# Patient Record
Sex: Female | Born: 1985 | Race: Black or African American | Hispanic: No | Marital: Single | Smoking: Never smoker
Health system: Southern US, Community
[De-identification: ages and names within clinical notes are randomized; demographics above are authoritative.]

## PROBLEM LIST (undated history)

## (undated) DIAGNOSIS — R42 Dizziness and giddiness: Secondary | ICD-10-CM

## (undated) DIAGNOSIS — R51 Headache: Secondary | ICD-10-CM

## (undated) DIAGNOSIS — F419 Anxiety disorder, unspecified: Secondary | ICD-10-CM

## (undated) DIAGNOSIS — G35 Multiple sclerosis: Secondary | ICD-10-CM

## (undated) DIAGNOSIS — R55 Syncope and collapse: Secondary | ICD-10-CM

## (undated) DIAGNOSIS — R519 Headache, unspecified: Secondary | ICD-10-CM

## (undated) DIAGNOSIS — H539 Unspecified visual disturbance: Secondary | ICD-10-CM

## (undated) HISTORY — PX: NO PAST SURGERIES: SHX2092

## (undated) HISTORY — DX: Anxiety disorder, unspecified: F41.9

## (undated) HISTORY — DX: Headache, unspecified: R51.9

## (undated) HISTORY — DX: Unspecified visual disturbance: H53.9

## (undated) HISTORY — DX: Dizziness and giddiness: R42

## (undated) HISTORY — DX: Syncope and collapse: R55

## (undated) HISTORY — DX: Headache: R51

---

## 2014-04-09 ENCOUNTER — Emergency Department (HOSPITAL_COMMUNITY)
Admission: EM | Admit: 2014-04-09 | Discharge: 2014-04-09 | Disposition: A | Discharge status: Home / Self Care | Payer: BC Managed Care – PPO | Attending: Emergency Medicine | Admitting: Emergency Medicine

## 2014-04-09 ENCOUNTER — Encounter (HOSPITAL_COMMUNITY): Payer: Self-pay | Admitting: Emergency Medicine

## 2014-04-09 DIAGNOSIS — S99919A Unspecified injury of unspecified ankle, initial encounter: Secondary | ICD-10-CM

## 2014-04-09 DIAGNOSIS — S0993XA Unspecified injury of face, initial encounter: Secondary | ICD-10-CM | POA: Diagnosis present

## 2014-04-09 DIAGNOSIS — Y9389 Activity, other specified: Secondary | ICD-10-CM | POA: Insufficient documentation

## 2014-04-09 DIAGNOSIS — Z8669 Personal history of other diseases of the nervous system and sense organs: Secondary | ICD-10-CM | POA: Insufficient documentation

## 2014-04-09 DIAGNOSIS — S139XXA Sprain of joints and ligaments of unspecified parts of neck, initial encounter: Secondary | ICD-10-CM | POA: Diagnosis not present

## 2014-04-09 DIAGNOSIS — S8990XA Unspecified injury of unspecified lower leg, initial encounter: Secondary | ICD-10-CM | POA: Diagnosis not present

## 2014-04-09 DIAGNOSIS — S99929A Unspecified injury of unspecified foot, initial encounter: Secondary | ICD-10-CM

## 2014-04-09 DIAGNOSIS — S161XXA Strain of muscle, fascia and tendon at neck level, initial encounter: Secondary | ICD-10-CM

## 2014-04-09 DIAGNOSIS — S0990XA Unspecified injury of head, initial encounter: Secondary | ICD-10-CM | POA: Insufficient documentation

## 2014-04-09 DIAGNOSIS — Y92410 Unspecified street and highway as the place of occurrence of the external cause: Secondary | ICD-10-CM | POA: Diagnosis not present

## 2014-04-09 DIAGNOSIS — IMO0002 Reserved for concepts with insufficient information to code with codable children: Secondary | ICD-10-CM | POA: Insufficient documentation

## 2014-04-09 HISTORY — DX: Multiple sclerosis: G35
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
## 2014-04-09 NOTE — ED Provider Notes (Signed)
Medical screening examination/treatment/procedure(s) were performed by non-physician practitioner and as supervising physician I was immediately available for consultation/collaboration.   EKG Interpretation None        Charles B. Sheldon, MD 04/09/14 2157 

## 2014-04-09 NOTE — Discharge Instructions (Signed)
Take up to 800mg  of ibuprofen three times a day for the next 3-4 days (take with food). Alternate heat and ice on sore muscles.  Activity as tolerated.  Return to the ER if the pain worsens or you develop numbness or weakness in one of your extremities.
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
## 2014-08-30 ENCOUNTER — Inpatient Hospital Stay (HOSPITAL_COMMUNITY)
Admission: AD | Admit: 2014-08-30 | Discharge: 2014-08-30 | Disposition: A | Discharge status: Home / Self Care | Payer: BC Managed Care – PPO | Source: Ambulatory Visit | Attending: Obstetrics and Gynecology | Admitting: Obstetrics and Gynecology

## 2014-08-30 DIAGNOSIS — A64 Unspecified sexually transmitted disease: Secondary | ICD-10-CM | POA: Insufficient documentation

## 2014-08-30 MED ORDER — CEFTRIAXONE SODIUM 250 MG IJ SOLR
250.0000 mg | Freq: Once | INTRAMUSCULAR | Status: AC
  Administered 2014-08-30: 250 mg via INTRAMUSCULAR
  Filled 2014-08-30: qty 250

## 2014-08-30 NOTE — MAU Note (Signed)
No reaction, no complaints.

## 2014-08-30 NOTE — MAU Note (Signed)
Partner is in [place], "done with him".

## 2014-08-30 NOTE — MAU Note (Signed)
[facility] NP had called on Friday regarding pt treatment.  Pt did not show, came in today. Called office for order.

## 2015-03-22 ENCOUNTER — Ambulatory Visit: Payer: 59 | Admitting: Family

## 2015-03-30 ENCOUNTER — Ambulatory Visit (INDEPENDENT_AMBULATORY_CARE_PROVIDER_SITE_OTHER): Payer: 59 | Admitting: Physician Assistant

## 2015-03-30 VITALS — BP 100/70 | HR 82 | Temp 97.7°F | Resp 20 | Ht 66.5 in | Wt 150.8 lb

## 2015-03-30 DIAGNOSIS — N898 Other specified noninflammatory disorders of vagina: Secondary | ICD-10-CM | POA: Diagnosis not present

## 2015-03-30 DIAGNOSIS — Z113 Encounter for screening for infections with a predominantly sexual mode of transmission: Secondary | ICD-10-CM | POA: Diagnosis not present

## 2015-03-30 DIAGNOSIS — Z30011 Encounter for initial prescription of contraceptive pills: Secondary | ICD-10-CM

## 2015-03-30 DIAGNOSIS — G35 Multiple sclerosis: Secondary | ICD-10-CM

## 2015-03-30 DIAGNOSIS — Z309 Encounter for contraceptive management, unspecified: Secondary | ICD-10-CM | POA: Diagnosis not present

## 2015-03-30 DIAGNOSIS — B373 Candidiasis of vulva and vagina: Secondary | ICD-10-CM

## 2015-03-30 DIAGNOSIS — B3731 Acute candidiasis of vulva and vagina: Secondary | ICD-10-CM

## 2015-03-30 LAB — POCT URINALYSIS DIPSTICK
Bilirubin, UA: NEGATIVE
Blood, UA: NEGATIVE
Glucose, UA: NEGATIVE
Ketones, UA: NEGATIVE
NITRITE UA: NEGATIVE
Protein, UA: NEGATIVE
SPEC GRAV UA: 1.015
Urobilinogen, UA: 0.2
pH, UA: 6.5

## 2015-03-30 LAB — POCT WET PREP WITH KOH
KOH Prep POC: POSITIVE
RBC Wet Prep HPF POC: NEGATIVE
TRICHOMONAS UA: NEGATIVE
Yeast Wet Prep HPF POC: NEGATIVE

## 2015-03-30 LAB — RPR

## 2015-03-30 LAB — HIV ANTIBODY (ROUTINE TESTING W REFLEX): HIV 1&2 Ab, 4th Generation: NONREACTIVE

## 2015-03-30 LAB — POCT UA - MICROSCOPIC ONLY
Casts, Ur, LPF, POC: NEGATIVE
Crystals, Ur, HPF, POC: NEGATIVE
MUCUS UA: NEGATIVE
YEAST UA: NEGATIVE

## 2015-03-30 MED ORDER — FLUCONAZOLE 150 MG PO TABS: 150.0000 mg | ORAL_TABLET | Freq: Once | ORAL | Status: DC

## 2015-03-30 MED ORDER — DROSPIRENONE-ETHINYL ESTRADIOL 3-0.03 MG PO TABS: 1.0000 | ORAL_TABLET | Freq: Every day | ORAL | Status: DC

## 2015-03-30 NOTE — Progress Notes (Signed)
Subjective:    Patient ID: Kristin Johnson, female    DOB: [birth year], 29 y.o.   MRN: [id number]  HPI Patient presents for vaginal discharge that has been present for 1 month. Discharge is described as being white "cottage cheese". Discharge initially had an odor that went away and then returned. Unable to describe odor. Denies blood in the discharge, itching, vaginal/abdominal/flank pain, N/V, fever, urinary/bowel sx, or dyspareunia. Has one sexual partner that she has had for less than a year and they do not use condoms. Has tried vinegar douche and intravaginal yogurt without any relief. No OTC therapies used. Past h/o gonorrhea and chlamydia that have both been treated. LMP 03/14/15 was normal. Last pap 2015 was normal. NKDA.  Not currently using any method of birth control, but has used pills, patch, and NuvaRing in the past. Would not mind if she became pregnant, but would like to consider birth control as she is unsure if she would like to be pregnant by current partner.   Review of Systems  Constitutional: Negative for fever.  Gastrointestinal: Negative for nausea, vomiting, abdominal pain, diarrhea and constipation.  Genitourinary: Positive for vaginal discharge. Negative for dysuria, urgency, frequency, hematuria, flank pain, decreased urine volume, vaginal bleeding, difficulty urinating, vaginal pain, menstrual problem, pelvic pain and dyspareunia.  Musculoskeletal: Negative for back pain.      Objective:   Physical Exam  Constitutional: She is oriented to person, place, and time. She appears well-developed and well-nourished. No distress.  Blood pressure 100/70, pulse 82, temperature 97.7 F (36.5 C), temperature source Oral, resp. rate 20, height 5' 6.5" (1.689 m), weight 150 lb 12.8 oz (68.402 kg), last menstrual period 03/14/2015, SpO2 98 %.  HENT:  Head: Normocephalic and atraumatic.  Right Ear: External ear normal.  Left Ear: External ear normal.  Eyes: Right eye exhibits no  discharge. Left eye exhibits no discharge.  Pulmonary/Chest: Effort normal.  Abdominal: Soft. Bowel sounds are normal. She exhibits no distension and no mass. There is tenderness in the suprapubic area. There is no rebound, no guarding and no CVA tenderness. No hernia.  Genitourinary: Uterus normal. There is no rash, tenderness, lesion or injury on the right labia. There is no rash, tenderness, lesion or injury on the left labia. Cervix exhibits no motion tenderness, no discharge and no friability. No erythema, tenderness or bleeding in the vagina. No foreign body around the vagina. No signs of injury around the vagina. Vaginal discharge (white, thick, and chunky) found.  Neurological: She is alert and oriented to person, place, and time.  Skin: Skin is warm and dry. No rash noted. She is not diaphoretic. No erythema. No pallor.  Psychiatric: She has a normal mood and affect. Her behavior is normal. Judgment and thought content normal.   Results for orders placed or performed in visit on 03/30/15  POCT UA - Microscopic Only  Result Value Ref Range   WBC, Ur, HPF, POC 1-4    RBC, urine, microscopic 0-1    Bacteria, U Microscopic trace    Mucus, UA negative    Epithelial cells, urine per micros 3-8    Crystals, Ur, HPF, POC negative    Casts, Ur, LPF, POC negative    Yeast, UA negative   POCT urinalysis dipstick  Result Value Ref Range   Color, UA yellow    Clarity, UA slightly cloudy    Glucose, UA negative    Bilirubin, UA negative    Ketones, UA negative    Spec Grav,  UA 1.015    Blood, UA negative    pH, UA 6.5    Protein, UA negative    Urobilinogen, UA 0.2    Nitrite, UA negative    Leukocytes, UA moderate (2+)   POCT Wet Prep with KOH  Result Value Ref Range   Trichomonas, UA Negative    Clue Cells Wet Prep HPF POC 0-4    Epithelial Wet Prep HPF POC 7-25    Yeast Wet Prep HPF POC negative    Bacteria Wet Prep HPF POC 1+    RBC Wet Prep HPF POC negative    WBC Wet Prep  HPF POC 0-1    KOH Prep POC Positive       Assessment & Plan:  1. Vaginal discharge 2. Yeast vaginitis Discontinue vinegar douching and putting yogurt into vagina. - POCT UA - Microscopic Only - POCT urinalysis dipstick - POCT Wet Prep with KOH - fluconazole (DIFLUCAN) 150 MG tablet; Take 1 tablet (150 mg total) by mouth once. Repeat if needed  Dispense: 2 tablet; Refill: 0  3. Screen for STD (sexually transmitted disease) - GC/Chlamydia Probe Amp - RPR - HIV antibody (with reflex)  4. BCP (birth control pills) initiation - drospirenone-ethinyl estradiol (YASMIN,ZARAH,SYEDA) 3-0.03 MG tablet; Take 1 tablet by mouth daily.  Dispense: 1 Package; Refill: 11   Adewale Pucillo PA-C  [facility] 03/30/2015 9:41 AM

## 2015-03-30 NOTE — Patient Instructions (Signed)

## 2015-03-31 LAB — GC/CHLAMYDIA PROBE AMP
CT PROBE, AMP APTIMA: NEGATIVE
GC Probe RNA: NEGATIVE

## 2015-04-01 ENCOUNTER — Encounter: Payer: Self-pay | Admitting: Physician Assistant

## 2015-04-14 ENCOUNTER — Ambulatory Visit: Payer: 59 | Admitting: Family

## 2015-06-01 ENCOUNTER — Ambulatory Visit (INDEPENDENT_AMBULATORY_CARE_PROVIDER_SITE_OTHER): Payer: 59 | Admitting: Physician Assistant

## 2015-06-01 VITALS — BP 110/80 | HR 84 | Temp 98.1°F | Resp 18 | Ht 67.5 in | Wt 157.2 lb

## 2015-06-01 DIAGNOSIS — F4310 Post-traumatic stress disorder, unspecified: Secondary | ICD-10-CM | POA: Diagnosis not present

## 2015-06-01 DIAGNOSIS — N898 Other specified noninflammatory disorders of vagina: Secondary | ICD-10-CM | POA: Diagnosis not present

## 2015-06-01 DIAGNOSIS — B9689 Other specified bacterial agents as the cause of diseases classified elsewhere: Secondary | ICD-10-CM

## 2015-06-01 DIAGNOSIS — J029 Acute pharyngitis, unspecified: Secondary | ICD-10-CM | POA: Diagnosis not present

## 2015-06-01 DIAGNOSIS — J3089 Other allergic rhinitis: Secondary | ICD-10-CM | POA: Diagnosis not present

## 2015-06-01 DIAGNOSIS — B3731 Acute candidiasis of vulva and vagina: Secondary | ICD-10-CM

## 2015-06-01 DIAGNOSIS — A499 Bacterial infection, unspecified: Secondary | ICD-10-CM | POA: Diagnosis not present

## 2015-06-01 DIAGNOSIS — B373 Candidiasis of vulva and vagina: Secondary | ICD-10-CM | POA: Diagnosis not present

## 2015-06-01 DIAGNOSIS — N760 Acute vaginitis: Secondary | ICD-10-CM | POA: Diagnosis not present

## 2015-06-01 LAB — POCT WET PREP WITH KOH
KOH Prep POC: NEGATIVE
RBC Wet Prep HPF POC: NEGATIVE
TRICHOMONAS UA: NEGATIVE
Yeast Wet Prep HPF POC: NEGATIVE

## 2015-06-01 MED ORDER — FLUCONAZOLE 150 MG PO TABS: 150.0000 mg | ORAL_TABLET | Freq: Once | ORAL | Status: DC

## 2015-06-01 MED ORDER — METRONIDAZOLE 500 MG PO TABS: 500.0000 mg | ORAL_TABLET | Freq: Two times a day (BID) | ORAL | Status: AC

## 2015-06-01 MED ORDER — MOMETASONE FUROATE 50 MCG/ACT NA SUSP: 2.0000 | Freq: Every day | NASAL | Status: DC

## 2015-06-01 NOTE — Patient Instructions (Signed)
I will contact you with your lab results as soon as they are available.   If you have not heard from me in 2 weeks, please contact me.  The fastest way to get your results is to register for My Chart (see the instructions on the last page of this printout).   For therapy -- [facility] ([address] [place], [place]) - [phone] [facility] [place]) - [phone] [facility] - [phone] [facility] - [phone] [facility]  - [phone] (do not file insurance)

## 2015-06-01 NOTE — Progress Notes (Signed)
Kristin Johnson  MRN: [id number] DOB: Aug 01, 1986  Subjective:  Pt presents to clinic with vaginal discharge and sore throat.  The vaginal discharge started about 2 weeks ago after she went to the beach and then she had her menses and thought it has gone away but now it smells and she is worried. Sexually active - new partner since her testing in April 2016 and wants to make sure she is ok.  She had sex without a condom with a new partner once about 2 weeks ago.  She has no sinus congestion but she has been clearing her throat and she noticed last pm some slight blood in mucus that she cleared from her throat.  She has noticed much less today when she cleared her throat.  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  She takes her Prozac only when she needs it  - she is on it for PTSD.  She does not really want to be on medication.  She stopped her OCP because of the cost.  Patient Active Problem List   Diagnosis Date Noted  . Post traumatic stress disorder (PTSD) 06/01/2015  . Multiple sclerosis 03/30/2015    Current Outpatient Prescriptions on File Prior to Visit  Medication Sig Dispense Refill  . glatiramer (COPAXONE) 20 MG/ML SOSY injection Inject 20 mg into the skin daily.    Marland Kitchen PRESCRIPTION MEDICATION Take 1 tablet by mouth daily as needed (for pain). Pt states that she takes Tramodol but does not know the strength.    Marland Kitchen aspirin 325 MG EC tablet Take 325 mg by mouth daily as needed for pain.     No current facility-administered medications on file prior to visit.   No Known Allergies  Review of Systems  Constitutional: Negative for fever and chills.  HENT: Positive for postnasal drip and sore throat. Negative for congestion and rhinorrhea.   Respiratory:  Negative for cough.   Genitourinary: Negative for dysuria, urgency, frequency, hematuria, vaginal bleeding, vaginal discharge (thin white) and menstrual problem.  Psychiatric/Behavioral: Positive for decreased concentration. Negative for suicidal ideas and self-injury. The patient is nervous/anxious.    Objective:  BP 110/80 mmHg  Pulse 84  Temp(Src) 98.1 F (36.7 C) (Oral)  Resp 18  Ht 5' 7.5" (1.715 m)  Wt 157 lb 4 oz (71.328 kg)  BMI 24.25 kg/m2  SpO2 98%  LMP 05/24/2015 (Exact Date)  Physical Exam  Constitutional: She is oriented to person, place, and time and well-developed, well-nourished, and in no distress.  HENT:  Head: Normocephalic and atraumatic.  Right Ear: Hearing, tympanic membrane, external ear and ear canal normal.  Left Ear: Hearing, tympanic membrane, external ear and ear canal normal.  Nose: Mucosal edema (pale boggy) present.  Mouth/Throat: Uvula is midline, oropharynx is clear and moist and mucous membranes are normal.  Eyes: Conjunctivae are normal.  Neck: Normal range of motion.  Cardiovascular: Normal rate, regular rhythm and normal heart sounds.   No murmur heard. Pulmonary/Chest: Effort normal and breath sounds normal. She has no wheezes.  Genitourinary: Uterus normal, right adnexa normal, left adnexa normal and vulva normal. Vagina exhibits normal mucosa and no lesion. Thin  white and vaginal discharge found.  Petechiae on cervix - not friable nulliparous  cervix  Neurological: She is alert and oriented to person, place, and time. Gait normal.  Skin: Skin is warm and dry.  Psychiatric: Mood, memory, affect and judgment normal.  Vitals reviewed.  Results for orders placed or performed in visit on 06/01/15  POCT Wet Prep with KOH  Result Value Ref Range   Trichomonas, UA Negative    Clue Cells Wet Prep HPF POC Moderate    Epithelial Wet Prep HPF POC Moderate Few, Moderate, Many   Yeast Wet Prep HPF POC Negative    Bacteria Wet Prep HPF POC Many (A)  Few   RBC Wet Prep HPF POC Negative    WBC Wet Prep HPF POC 0-3    KOH Prep POC Negative     Assessment and Plan :  Vaginal discharge - Plan: POCT Wet Prep with KOH, GC/Chlamydia Probe Amp - d/w pt safe sex and the importance - she wants to think about the Nexplanon and then maybe get that or go back to OCP but she does not want anything other than Yasmin for OCP but does not want to pay the high cost of it with her insurance.  I strongly suggested pt use condoms until she does something for pregnancy protection and then still use condoms for barrier to STDs.  BV (bacterial vaginosis) - Plan: metroNIDAZOLE (FLAGYL) 500 MG tablet  Yeast vaginitis - Plan: fluconazole (DIFLUCAN) 150 MG tablet  Post traumatic stress disorder (PTSD) - d/w pt that prn use of Prozac is not helpful for her PTSD - she needs therapy to help with this as well as a therapist will be able to do an evaluation for ADD esp since she was able to get through school without treatment - I suspect that her PTSD is not controlled therefore giving her ADD symptoms.  Other allergic rhinitis - Plan: mometasone (NASONEX) 50 MCG/ACT nasal spray  Sore throat - expect related to PND from allergies   Benny Lennert PA-C  [facility] 06/01/2015 7:53 PM

## 2015-06-06 ENCOUNTER — Telehealth: Payer: Self-pay

## 2015-06-06 NOTE — Telephone Encounter (Signed)
PA needed for nasonex. LMOM for pt CB to ask what other NS/med has she tried for AR?

## 2015-06-06 NOTE — Telephone Encounter (Signed)
Pt called back and stated that she has never tried any other NS for AR. The PA for nasonex will not be approved if she has not first tried/failed preferred alternative. Flonase is usually covered. Can someone send in a Rx for this? I have pended it. Also, pt reported that she is not able to take the Flagyl. It is making her very nauseous and she is throwing up a lot from it and can not keep it done. She is requesting a Rx for the suppositories to treat BV instead.

## 2015-06-07 LAB — GC/CHLAMYDIA PROBE AMP
CT PROBE, AMP APTIMA: NEGATIVE
GC PROBE AMP APTIMA: NEGATIVE

## 2015-06-07 MED ORDER — METRONIDAZOLE 0.75 % VA GEL: 1.0000 | Freq: Every day | VAGINAL | Status: AC

## 2015-06-07 MED ORDER — FLUTICASONE PROPIONATE 50 MCG/ACT NA SUSP: 2.0000 | Freq: Every day | NASAL | Status: DC

## 2015-06-07 NOTE — Telephone Encounter (Signed)
Done

## 2015-06-07 NOTE — Telephone Encounter (Signed)
Left VM saying medications have been sent to pharmacy.

## 2015-06-24 ENCOUNTER — Telehealth: Payer: Self-pay

## 2015-06-24 MED ORDER — NORELGESTROMIN-ETH ESTRADIOL 150-35 MCG/24HR TD PTWK: 1.0000 | MEDICATED_PATCH | TRANSDERMAL | Status: DC

## 2015-06-24 NOTE — Telephone Encounter (Signed)
Pt is interested in switching from her BCP to "the patch", she continues to forget the pills  [facility] [place]

## 2015-06-24 NOTE — Telephone Encounter (Signed)
Spoke with pt, advised message from Mario. Pt understood. 

## 2015-06-24 NOTE — Telephone Encounter (Signed)
I'm assuming that the previous message meant OCP instead of BCP. Switching to patches is fine. If she has not been here for contraception counseling, I recommend she come in for a visit. There are several other options that are more effective that she can try. In the meantime I will provide patient with script for patches. Patch is to be used once a week for 3 weeks. The fourth week is patch free week. Please have patient review instructions thoroughly as she is not being seen in clinic for this and come in for an office visit if she needs counseling. No smoking while using the patch. Thank you!

## 2015-10-03 ENCOUNTER — Ambulatory Visit (INDEPENDENT_AMBULATORY_CARE_PROVIDER_SITE_OTHER): Payer: 59 | Admitting: Family Medicine

## 2015-10-03 VITALS — BP 104/64 | HR 77 | Temp 97.6°F | Resp 18 | Ht 67.5 in | Wt 158.0 lb

## 2015-10-03 DIAGNOSIS — N921 Excessive and frequent menstruation with irregular cycle: Secondary | ICD-10-CM

## 2015-10-03 DIAGNOSIS — R55 Syncope and collapse: Secondary | ICD-10-CM | POA: Diagnosis not present

## 2015-10-03 LAB — POCT CBC
Granulocyte percent: 72 %G (ref 37–80)
HCT, POC: 34.9 % — AB (ref 37.7–47.9)
Hemoglobin: 11.4 g/dL — AB (ref 12.2–16.2)
LYMPH, POC: 1.8 (ref 0.6–3.4)
MCH, POC: 27.1 pg (ref 27–31.2)
MCHC: 32.8 g/dL (ref 31.8–35.4)
MCV: 82.5 fL (ref 80–97)
MID (CBC): 0.5 (ref 0–0.9)
MPV: 7.4 fL (ref 0–99.8)
PLATELET COUNT, POC: 265 10*3/uL (ref 142–424)
POC Granulocyte: 6 (ref 2–6.9)
POC LYMPH %: 21.6 % (ref 10–50)
POC MID %: 6.4 %M (ref 0–12)
RBC: 4.22 M/uL (ref 4.04–5.48)
RDW, POC: 13.1 %
WBC: 8.3 10*3/uL (ref 4.6–10.2)

## 2015-10-03 LAB — TSH: TSH: 0.966 u[IU]/mL (ref 0.350–4.500)

## 2015-10-03 LAB — POCT URINE PREGNANCY: Preg Test, Ur: NEGATIVE

## 2015-10-03 MED ORDER — ONDANSETRON HCL 8 MG PO TABS: 8.0000 mg | ORAL_TABLET | Freq: Three times a day (TID) | ORAL | Status: DC | PRN

## 2015-10-03 MED ORDER — NORGESTIMATE-ETH ESTRADIOL 0.25-35 MG-MCG PO TABS: ORAL_TABLET | ORAL | Status: DC

## 2015-10-03 NOTE — Progress Notes (Signed)
[facility] [address], [place] [address] [phone]  Date:  10/03/2015   Name:  Kristin Johnson   DOB:  08/14/1986   MRN:  [id number]  PCP:  No PCP Per Patient    Chief Complaint: Been on menses for 1 month   History of Present Illness:  Kristin Johnson is a 29 y.o. very pleasant female patient who presents with the following:  "Kristin Johnson" is here today with concern about prolonged menstrual bleeding She started on OCP this spring.  She took for a few weeks, and then stopped. She went back on the patch for a little while.  She then switched back to pills (yasmin) in July.  She notes prolonged menses since she went back on the pill in July.  She has had 3-4 week bleeding episodes.   She "passed out" in July, and then again 2 days ago.  She describes this most recent incident as feeling nauseated, then lightheaded.  She fell onto some exercise matts because she was at the gym at the time. She had been exercising, then stopped and was chatting with a friend when she had LOC. She was out for "just a few seconds."  No incontinence.    She has used the patch and the ring without problems in her teens.  Her menses have generally lasted 3-5 days, never had prolonged bleeding in the past  Today she has noted uterine cramps, and is passing large clots.   Not a smoker, never had a PE or DVT  She has passed out several times over her life- this has been thought to be due to MS and assoicated low BP.  She was dx with MS in July 2011. Her syncope was evalauted fully (including EKG) at that time per her report.  She is mainly concerned that her syncope 2 days ago could have been due to anemia- reassured that this is unlikely with a hg of 11.  She declines further specific eval of her syncope at this time as she has experienced it several times before.   Dr. Jackelyn Knife at [facility] is her gyncecologist   Patient Active Problem List   Diagnosis Date Noted  .  Post traumatic stress disorder (PTSD) 06/01/2015  . Multiple sclerosis (HCC) 03/30/2015    Past Medical History  Diagnosis Date  . Multiple sclerosis (HCC)   . Anxiety     No past surgical history on file.  Social History  Substance Use Topics  . Smoking status: Never Smoker   . Smokeless tobacco: Never Used  . Alcohol Use: 0.0 oz/week    0 Standard drinks or equivalent per week    No family history on file.  No Known Allergies  Medication list has been reviewed and updated.  Current Outpatient Prescriptions on File Prior to Visit  Medication Sig Dispense Refill  . FLUoxetine (PROZAC) 10 MG tablet Take 10 mg by mouth daily.    Marland Kitchen glatiramer (COPAXONE) 20 MG/ML SOSY injection Inject 20 mg into the skin daily.    . norelgestromin-ethinyl estradiol (ORTHO EVRA) 150-35 MCG/24HR transdermal patch Place 1 patch onto the skin once a week. (Patient not taking: Reported on 10/03/2015) 3 patch 12   No current facility-administered medications on file prior to visit.    Review of Systems: As per HPI- otherwise negative.  Physical Examination:  Filed Vitals:   10/03/15 1220  BP: 104/64  Pulse: 77  Temp: 97.6 F (36.4 C)  Resp: 18   Filed Vitals:   10/03/15 1220  Height: 5' 7.5" (1.715 m)  Weight: 158 lb (71.668 kg)   Body mass index is 24.37 kg/(m^2). Ideal Body Weight: Weight in (lb) to have BMI = 25: 161.7  GEN: WDWN, NAD, Non-toxic, A & O x 3, looks well HEENT: Atraumatic, Normocephalic. Neck supple. No masses, No LAD. Bilateral TM wnl, oropharynx normal.  PEERL,EOMI. conjuntivae are minimally pale Ears and Nose: No external deformity. CV: RRR, No M/G/R. No JVD. No thrill. No extra heart sounds. PULM: CTA B, no wheezes, crackles, rhonchi. No retractions. No resp. distress. No accessory muscle use. ABD: S, NT, ND, +BS. No rebound. No HSM. EXTR: No c/c/e NEURO Normal gait.  PSYCH: Normally interactive. Conversant. Not depressed or anxious  appearing.  Calm demeanor.  Pelvic: normal, no vaginal lesions or discharge.  There is minor bleeding from the cervical os. Uterus normal, no CMT, no adnexal tendereness or masses  BP Readings from Last 3 Encounters:  10/03/15 104/64  06/01/15 110/80  03/30/15 100/70     Results for orders placed or performed in visit on 10/03/15  GC/Chlamydia Probe Amp  Result Value Ref Range   CT Probe RNA NEGATIVE    GC Probe RNA NEGATIVE   TSH  Result Value Ref Range   TSH 0.966 0.350 - 4.500 uIU/mL  POCT urine pregnancy  Result Value Ref Range   Preg Test, Ur Negative Negative  POCT CBC  Result Value Ref Range   WBC 8.3 4.6 - 10.2 K/uL   Lymph, poc 1.8 0.6 - 3.4   POC LYMPH PERCENT 21.6 10 - 50 %L   MID (cbc) 0.5 0 - 0.9   POC MID % 6.4 0 - 12 %M   POC Granulocyte 6.0 2 - 6.9   Granulocyte percent 72.0 37 - 80 %G   RBC 4.22 4.04 - 5.48 M/uL   Hemoglobin 11.4 (A) 12.2 - 16.2 g/dL   HCT, POC 69.6 (A) 29.5 - 47.9 %   MCV 82.5 80 - 97 fL   MCH, POC 27.1 27 - 31.2 pg   MCHC 32.8 31.8 - 35.4 g/dL   RDW, POC 28.4 %   Platelet Count, POC 265 142 - 424 K/uL   MPV 7.4 0 - 99.8 fL    Assessment and Plan: Menorrhagia with irregular cycle - Plan: GC/Chlamydia Probe Amp, POCT urine pregnancy, POCT CBC, TSH, norgestimate-ethinyl estradiol (ORTHO-CYCLEN,SPRINTEC,PREVIFEM) 0.25-35 MG-MCG tablet, ondansetron (ZOFRAN) 8 MG tablet  Syncope, unspecified syncope type   Here today with menorrhagia. Had planned to start her on high dose OCP and then have her see her OBG.  Made an appt with OBG on Thursday.  However on further review Kristin Johnson admits that she does get headaches that she was told were "ocular headaches," where she may experience aura.  Given this potential risk factor to estrogen use would like to defer higher dose estrogen since she is hemodynamically stable and just minimally anemic.  For the time being will use no therapy, and will discuss with Dr. Jackelyn Knife tomorrow (he is out of the office  today) about any therapy he might like to give her prior to upcoming appt.    Asked her to let me know if any change or worsening in her bleeding pattern in the meantime.   Offered further evaluation of her syncope- pt declines at this time as this  is not a new problem for her and she does not feel particularly concerned.   Signed Abbe Amsterdam, MD Work [phone]- called Kristin Johnson there.     Spoke with Dr. Jackelyn Knife on 11/1- he suggested that we start her on Aygestin 5mg  a day for 14 days.  Called in Rx and called pt- LMOM at her work phone

## 2015-10-03 NOTE — Patient Instructions (Addendum)
Stop taking your current birth control pill Start the "sprintec" pill that we gave you today.  Take 3 pills today and tomorrow, then 2 pills a day for 2 days, then 1 pill a day to finish the pack We will get you in with your OG-GYN doctor; we will call you with details If your bleeding is not stopped in 2-3 days please let me know  The higher dose of birth control pill may cause nausea- use the zofran if needed for this

## 2015-10-04 LAB — GC/CHLAMYDIA PROBE AMP
CT PROBE, AMP APTIMA: NEGATIVE
GC PROBE AMP APTIMA: NEGATIVE

## 2015-10-04 MED ORDER — NORETHINDRONE ACETATE 5 MG PO TABS: 5.0000 mg | ORAL_TABLET | Freq: Every day | ORAL | Status: DC

## 2015-10-05 ENCOUNTER — Encounter: Payer: Self-pay | Admitting: Family Medicine

## 2016-06-08 ENCOUNTER — Ambulatory Visit (INDEPENDENT_AMBULATORY_CARE_PROVIDER_SITE_OTHER): Payer: BLUE CROSS/BLUE SHIELD | Admitting: Osteopathic Medicine

## 2016-06-08 VITALS — BP 98/62 | HR 77 | Temp 98.3°F | Resp 16 | Ht 66.0 in | Wt 160.4 lb

## 2016-06-08 DIAGNOSIS — N898 Other specified noninflammatory disorders of vagina: Secondary | ICD-10-CM

## 2016-06-08 DIAGNOSIS — A499 Bacterial infection, unspecified: Secondary | ICD-10-CM

## 2016-06-08 DIAGNOSIS — B9689 Other specified bacterial agents as the cause of diseases classified elsewhere: Secondary | ICD-10-CM

## 2016-06-08 DIAGNOSIS — N760 Acute vaginitis: Secondary | ICD-10-CM

## 2016-06-08 LAB — POCT WET PREP WITH KOH
KOH PREP POC: NEGATIVE
Trichomonas, UA: NEGATIVE

## 2016-06-08 MED ORDER — FLUCONAZOLE 150 MG PO TABS: 150.0000 mg | ORAL_TABLET | Freq: Once | ORAL | Status: DC

## 2016-06-08 MED ORDER — METRONIDAZOLE 500 MG PO TABS: 500.0000 mg | ORAL_TABLET | Freq: Two times a day (BID) | ORAL | Status: AC

## 2016-06-08 NOTE — Progress Notes (Signed)
HPI: Kristin Johnson is a 30 y.o. female who presents to [facility] today for chief complaint of:  Chief Complaint  Patient presents with  . Vaginal Discharge    with odor since the end of May 2017     . Context: Monogamous with partner since last STD testing, . Quality: Vaginal discharge with odor, not getting any better . Duration: About a month . Assoc signs/symptoms: No fever or chills, no lower abdominal pain. Patient is compliant with contraception Of note, patient has questions about how much the testing is going to cost, I told her that I would not be able to give her a reliable estimate without knowing details about her insurance. Patient requests minimal testing be done today.  Past medical, social and family history reviewed: Past Medical History  Diagnosis Date  . Multiple sclerosis (HCC)   . Anxiety    No past surgical history on file. Social History  Substance Use Topics  . Smoking status: Never Smoker   . Smokeless tobacco: Never Used  . Alcohol Use: 0.0 oz/week    0 Standard drinks or equivalent per week   No family history on file.  Current Outpatient Prescriptions  Medication Sig Dispense Refill  . etonogestrel-ethinyl estradiol (NUVARING) 0.12-0.015 MG/24HR vaginal ring Place 1 each vaginally every 28 (twenty-eight) days. Insert vaginally and leave in place for 3 consecutive weeks, then remove for 1 week.    Marland Kitchen FLUoxetine (PROZAC) 10 MG tablet Take 10 mg by mouth daily. Reported on 06/08/2016    . glatiramer (COPAXONE) 20 MG/ML SOSY injection Inject 20 mg into the skin daily. Reported on 06/08/2016    . norethindrone (AYGESTIN) 5 MG tablet Take 1 tablet (5 mg total) by mouth daily. 14 tablet 0  . ondansetron (ZOFRAN) 8 MG tablet Take 1 tablet (8 mg total) by mouth every 8 (eight) hours as needed for nausea or vomiting. (Patient not taking: Reported on 06/08/2016) 15 tablet 0   No current facility-administered medications for this  visit.   No Known Allergies    Review of Systems: CONSTITUTIONAL:  No  fever, no chills GASTROINTESTINAL: No  nausea, No  vomiting, No  abdominal pain, No  blood in stool, No  diarrhea, No  constipation  MUSCULOSKELETAL: No  myalgia/arthralgia GENITOURINARY: No  incontinence, (+) abnormal genital odor & discharge SKIN: No  rash/wounds/concerning lesions   Exam:  BP 98/62 mmHg  Pulse 77  Temp(Src) 98.3 F (36.8 C) (Oral)  Resp 16  Ht 5\' 6"  (1.676 m)  Wt 160 lb 6.4 oz (72.757 kg)  BMI 25.90 kg/m2  SpO2 100%  LMP 04/24/2016 Constitutional: VS see above. General Appearance: alert, well-developed, well-nourished, NAD Skin: warm, dry, intact. No rash/ulcer. No concerning nevi or subq nodules on limited exam.  GYN: No lesions/ulcers to external genitalia, normal urethra, normal vaginal mucosa, thin greyish discharge, cervix normal without lesions    Results for orders placed or performed in visit on 06/08/16 (from the past 72 hour(s))  POCT Wet Prep with KOH     Status: Abnormal   Collection Time: 06/08/16  7:14 PM  Result Value Ref Range   Trichomonas, UA Negative    Clue Cells Wet Prep HPF POC few    Epithelial Wet Prep HPF POC Moderate Few, Moderate, Many, Too numerous to count   Yeast Wet Prep HPF POC none    Bacteria Wet Prep HPF POC Many (A) None, Few, Too numerous to count   RBC Wet Prep HPF  POC moderate    WBC Wet Prep HPF POC none    KOH Prep POC Negative      ASSESSMENT/PLAN: See instructions which were printed for patient. We did minimal testing today to get diagnosis of bacterial vaginosis, I still recommended testing for sexual transmitted infections if the symptoms do not improve with treatment. Patient also requests we send Diflucan and since she tends to get yeast infections when she is treated for BV, will go ahead and send this as well  Vaginal discharge - Plan: POCT Wet Prep with KOH  Bacterial vaginal infection  Bacterial vaginosis - Plan: metroNIDAZOLE  (FLAGYL) 500 MG tablet     All questions were answered. Visit summary with medication list and pertinent instructions was printed for patient to review. ER/RTC precautions were reviewed with the patient. Return if symptoms worsen or fail to improve.

## 2016-06-08 NOTE — Patient Instructions (Addendum)
     IF you received an x-ray today, you will receive an invoice from [facility]. Please contact [facility] at [phone] with questions or concerns regarding your invoice.   IF you received labwork today, you will receive an invoice from [employer]. Please contact [facility] at [phone] with questions or concerns regarding your invoice.   Our billing staff will not be able to assist you with questions regarding bills from these companies.  You will be contacted with the lab results as soon as they are available. The fastest way to get your results is to activate your My Chart account. Instructions are located on the last page of this paperwork. If you have not heard from [place] regarding the results in 2 weeks, please contact this office.    Chest in office today was consistent with bacterial vaginosis, we have sent in an antibiotic to treat this condition which can cause unusual/unpleasant odor as well as vaginal discharge. If symptoms do not improve on treatment, would certainly recommend testing for sexually transmitted infection such as gonorrhea or chlamydia. We did not do these tests today per your request. If you have any other questions or concerns, please do not hesitate to let us know if there is any way we can help.

## 2016-06-13 ENCOUNTER — Telehealth: Payer: Self-pay

## 2016-06-13 DIAGNOSIS — B9689 Other specified bacterial agents as the cause of diseases classified elsewhere: Secondary | ICD-10-CM

## 2016-06-13 DIAGNOSIS — N760 Acute vaginitis: Principal | ICD-10-CM

## 2016-06-13 NOTE — Telephone Encounter (Signed)
PATIENT SAW DR. ALEXANDER ABOUT A WEEK AGO FOR A YEAST INFECTION. SHE WAS GIVEN GENERIC FLAGYL, HOWEVER, SHE CAN NOT KEEP IT DOWN. IT TASTE SO BAD AND DOES  NOT AGREE WITH HER STOMACH. SHE WOULD LIKE TO GET AN INJECTION. PLEASE CALL HER BACK TO LET HER KNOW IF SHE CAN COME IN TO GET ONE. BEST PHONE [phone] (CELL)  PHARMACY CHOICE IS [facility] ON [address]  MBC

## 2016-06-14 MED ORDER — METRONIDAZOLE 0.75 % VA GEL: 1.0000 | Freq: Every day | VAGINAL | Status: DC

## 2016-06-14 NOTE — Telephone Encounter (Signed)
No injection. She was treated with Flagyl for bacterial vaginosis, we can treat this with vaginally applied medicine as well which I have called in. At her visit she requested minimal tests due to cost concerns - if no better she will need to come in for testing for STI.

## 2016-07-07 ENCOUNTER — Ambulatory Visit (INDEPENDENT_AMBULATORY_CARE_PROVIDER_SITE_OTHER): Payer: BLUE CROSS/BLUE SHIELD | Admitting: Osteopathic Medicine

## 2016-07-07 VITALS — BP 118/68 | HR 84 | Temp 98.1°F | Ht 66.0 in | Wt 156.0 lb

## 2016-07-07 DIAGNOSIS — R300 Dysuria: Secondary | ICD-10-CM

## 2016-07-07 DIAGNOSIS — N898 Other specified noninflammatory disorders of vagina: Secondary | ICD-10-CM

## 2016-07-07 LAB — POC MICROSCOPIC URINALYSIS (UMFC): Mucus: ABSENT

## 2016-07-07 LAB — POCT URINALYSIS DIP (MANUAL ENTRY)
Bilirubin, UA: NEGATIVE
Blood, UA: NEGATIVE
GLUCOSE UA: NEGATIVE
LEUKOCYTES UA: NEGATIVE
NITRITE UA: NEGATIVE
Protein Ur, POC: NEGATIVE
Spec Grav, UA: 1.02
UROBILINOGEN UA: 0.2
pH, UA: 7

## 2016-07-07 LAB — POCT WET PREP WITH KOH
KOH Prep POC: NEGATIVE
TRICHOMONAS UA: NEGATIVE

## 2016-07-07 NOTE — Patient Instructions (Addendum)
  We will call once results become available for STD, this takes a few days to come back - let us know if you don't hear anything by end of the week. If gonorrhea or chlamydia is positive, would highly recommend test for HIV as well. If discharge persists and all test are negative, consider switch birth control as the Nuva Ring hormones may be causing your discharge.    IF you received an x-ray today, you will receive an invoice from [facility]. Please contact [facility] at [phone] with questions or concerns regarding your invoice.   IF you received labwork today, you will receive an invoice from [employer]. Please contact [facility] at [phone] with questions or concerns regarding your invoice.   Our billing staff will not be able to assist you with questions regarding bills from these companies.  You will be contacted with the lab results as soon as they are available. The fastest way to get your results is to activate your My Chart account. Instructions are located on the last page of this paperwork. If you have not heard from [place] regarding the results in 2 weeks, please contact this office.

## 2016-07-07 NOTE — Progress Notes (Signed)
HPI: Kristin Johnson is a 30 y.o.  female  who [facility] 07/07/16,  for chief complaint of:  Chief Complaint  Patient presents with  . Vaginal Discharge    one month     . Location/Context: Recently seen in the office for vaginal discharge, treated with metronidazole and Diflucan, discharge is not improved.  Quality: thin discharge, bit itchy at times, no skin changes . Modifying factors: didn't get better with medication as noted above, used cream (not sure name, labeled for yeast infection use, 7-day cream).     Past medical, surgical, social and family history reviewed: Past Medical History:  Diagnosis Date  . Anxiety   . Multiple sclerosis (HCC)    No past surgical history on file. Social History  Substance Use Topics  . Smoking status: Never Smoker  . Smokeless tobacco: Never Used  . Alcohol use 0.0 oz/week   No family history on file.   Current medication list and allergy/intolerance information reviewed:   Current Outpatient Prescriptions  Medication Sig Dispense Refill  . etonogestrel-ethinyl estradiol (NUVARING) 0.12-0.015 MG/24HR vaginal ring Place 1 each vaginally every 28 (twenty-eight) days. Insert vaginally and leave in place for 3 consecutive weeks, then remove for 1 week.     No current facility-administered medications for this visit.    No Known Allergies    Review of Systems:  Constitutional:  No  fever, no chills, No recent illness, No unintentional weight changes. No significant fatigue.   Gastrointestinal: No  abdominal pain, No  nausea, No  vomiting,   Musculoskeletal: No new myalgia/arthralgia  Genitourinary: No  incontinence, No  abnormal genital bleeding, (+)bothersome genital discharge  Skin: No  Rash   Exam:  BP 118/68   Pulse 84   Temp 98.1 F (36.7 C) (Oral)   Ht 5\' 6"  (1.676 m)   Wt 156 lb (70.8 kg)   LMP 04/24/2016   SpO2 97%   BMI 25.18 kg/m   Constitutional: VS see above.  General Appearance: alert, well-developed, well-nourished, NAD  Skin: warm, dry, intact. No rash/ulcer. No concerning nevi or subq nodules on limited exam.   GYN: No lesions/ulcers to external genitalia, normal urethra, normal vaginal mucosa, physiologic discharge, cervix normal without lesions, uterus not enlarged or tender, adnexa no masses and nontender     Results for orders placed or performed in visit on 07/07/16 (from the past 72 hour(s))  POCT Microscopic Urinalysis ([facility])     Status: Abnormal   Collection Time: 07/07/16 11:51 AM  Result Value Ref Range   WBC,UR,HPF,POC Few (A) None WBC/hpf   RBC,UR,HPF,POC None None RBC/hpf   Bacteria None None, Too numerous to count   Mucus Absent Absent   Epithelial Cells, UR Per Microscopy Few (A) None, Too numerous to count cells/hpf  POCT urinalysis dipstick     Status: Abnormal   Collection Time: 07/07/16 11:51 AM  Result Value Ref Range   Color, UA yellow yellow   Clarity, UA clear clear   Glucose, UA negative negative   Bilirubin, UA negative negative   Ketones, POC UA trace (5) (A) negative   Spec Grav, UA 1.020    Blood, UA negative negative   pH, UA 7.0    Protein Ur, POC negative negative   Urobilinogen, UA 0.2    Nitrite, UA Negative Negative   Leukocytes, UA Negative Negative  POCT Wet Prep with KOH     Status: None   Collection Time: 07/07/16 12:30 PM  Result Value Ref Range   Trichomonas, UA Negative    Clue Cells Wet Prep HPF POC None    Epithelial Wet Prep HPF POC Few Few, Moderate, Many, Too numerous to count   Yeast Wet Prep HPF POC None    Bacteria Wet Prep HPF POC Few None, Few, Too numerous to count   RBC Wet Prep HPF POC None    WBC Wet Prep HPF POC None    KOH Prep POC Negative       ASSESSMENT/PLAN:   Persistent bothersome vaginal discharge, prep negative, at last visit patient declined gonorrhea and chlamydia testing so we'll update this today.   Discharge may be due to patient's normal  physiology, alternatively may be due to NuvaRing effects. Advised as long as infection testing negative, probably nothing to worry about and she would need to consider whether changing birth control is worth it given that she has had problems with other OCP and is hesitant to do LARC such as arm insert or IUD  Patient wants to hold off on testing not directly pertinent to her discharge, recommend Pap next annual physical. Patient declines pregnancy test today  Vaginal discharge - Plan: POCT Wet Prep with KOH, GC/Chlamydia Probe Amp, CANCELED: GC/chlamydia probe amp, genital  Dysuria - Plan: POCT Microscopic Urinalysis ([facility]), POCT urinalysis dipstick    Visit summary with medication list and pertinent instructions was printed for patient to review. All questions at time of visit were answered - patient instructed to contact office with any additional concerns. ER/RTC precautions were reviewed with the patient. Follow-up plan: Return if symptoms worsen or fail to improve.

## 2016-07-09 LAB — GC/CHLAMYDIA PROBE AMP
CT PROBE, AMP APTIMA: NOT DETECTED
GC Probe RNA: NOT DETECTED

## 2016-07-10 ENCOUNTER — Other Ambulatory Visit: Payer: Self-pay | Admitting: Osteopathic Medicine

## 2016-07-10 ENCOUNTER — Telehealth: Payer: Self-pay | Admitting: *Deleted

## 2016-07-10 DIAGNOSIS — Z30011 Encounter for initial prescription of contraceptive pills: Secondary | ICD-10-CM

## 2016-07-10 MED ORDER — DROSPIRENONE-ETHINYL ESTRADIOL 3-0.03 MG PO TABS
1.0000 | ORAL_TABLET | Freq: Every day | ORAL | 11 refills | Status: DC
Start: 2016-07-10 — End: 2016-10-04

## 2016-07-10 NOTE — Telephone Encounter (Signed)
Patient was called back and advised on medication that was sent over.  She was very thankful.

## 2016-07-10 NOTE — Telephone Encounter (Signed)
Okay, I refilled the birth control pill that was on file for her, please let her know that we sent this to her pharmacy. Thanks.

## 2016-10-04 ENCOUNTER — Ambulatory Visit (INDEPENDENT_AMBULATORY_CARE_PROVIDER_SITE_OTHER): Payer: BLUE CROSS/BLUE SHIELD | Admitting: Physician Assistant

## 2016-10-04 VITALS — BP 116/76 | HR 90 | Temp 98.3°F | Resp 17 | Ht 66.0 in | Wt 157.0 lb

## 2016-10-04 DIAGNOSIS — N898 Other specified noninflammatory disorders of vagina: Secondary | ICD-10-CM | POA: Diagnosis not present

## 2016-10-04 LAB — POCT WET + KOH PREP
Trich by wet prep: ABSENT
Yeast by KOH: ABSENT
Yeast by wet prep: ABSENT

## 2016-10-04 NOTE — Patient Instructions (Addendum)
Download app to track your periods.  Call me Sat if you are still experiencing symptoms.     IF you received an x-ray today, you will receive an invoice from [facility]. Please contact [facility] at [phone] with questions or concerns regarding your invoice.   IF you received labwork today, you will receive an invoice from [employer]. Please contact [facility] at [phone] with questions or concerns regarding your invoice.   Our billing staff will not be able to assist you with questions regarding bills from these companies.  You will be contacted with the lab results as soon as they are available. The fastest way to get your results is to activate your My Chart account. Instructions are located on the last page of this paperwork. If you have not heard from [place] regarding the results in 2 weeks, please contact this office.

## 2016-10-04 NOTE — Progress Notes (Signed)
   Kristin Johnson  MRN: [id number] DOB: 1986-06-22  PCP: Virgilio Belling  Subjective:  Pt is a 30 year old female who presents to clinic for vaginal discharge x 2 days.  Noticed foul odor yesterday, today she has discharge described as thick and light brown.  She reports a history of BV and yeast infections. Last BV infection was three months ago. States when she was treated for BV in the past, she developed subsequent yeast infections.  She is not concerned with STD testing today, does not want to be tested. She has one sexual partner, does not use condoms.  LMP last week.   Denies fever, chills, abdominal pain, pain with intercourse, flank pain, urinary symptoms.   Review of Systems  Constitutional: Negative for chills, fatigue and fever.  Gastrointestinal: Negative for abdominal pain, diarrhea, nausea and vomiting.  Genitourinary: Positive for vaginal discharge. Negative for dyspareunia, dysuria, flank pain, frequency, menstrual problem, pelvic pain and urgency.  Skin: Negative.     Patient Active Problem List   Diagnosis Date Noted  . Post traumatic stress disorder (PTSD) 06/01/2015  . Multiple sclerosis (HCC) 03/30/2015    No current outpatient prescriptions on file prior to visit.   No current facility-administered medications on file prior to visit.     No Known Allergies  Objective:  BP 116/76 (BP Location: Left Arm, Patient Position: Sitting, Cuff Size: Normal)   Pulse 90   Temp 98.3 F (36.8 C) (Oral)   Resp 17   Ht 5\' 6"  (1.676 m)   Wt 157 lb (71.2 kg)   LMP 09/29/2016 (Approximate)   SpO2 98%   BMI 25.34 kg/m   Physical Exam  Constitutional: She is oriented to person, place, and time and well-developed, well-nourished, and in no distress. No distress.  Cardiovascular: Normal rate, regular rhythm and normal heart sounds.   Pulmonary/Chest: Effort normal. No respiratory distress.  Abdominal: Soft. There is no tenderness.  Neurological: She is alert and  oriented to person, place, and time. GCS score is 15.  Skin: Skin is warm and dry.  Psychiatric: Mood, memory, affect and judgment normal.  Vitals reviewed.  Results for orders placed or performed in visit on 10/04/16  POCT Wet + KOH Prep  Result Value Ref Range   Yeast by KOH Absent Present, Absent   Yeast by wet prep Absent Present, Absent   WBC by wet prep Few None, Few, Too numerous to count   Clue Cells Wet Prep HPF POC None None, Too numerous to count   Trich by wet prep Absent Present, Absent   Bacteria Wet Prep HPF POC None None, Few, Too numerous to count   Epithelial Cells By [employer] ([facility]) Few None, Few, Too numerous to count   RBC,UR,HPF,POC None None RBC/hpf    Assessment and Plan :  1. Vaginal odor 2. Vaginal discharge - POCT Wet + KOH Prep - Patient encouraged to download app on her phone to track her periods, or to keep a diary of her symptoms. She has been here three times recently, each at the beginning of the month with similar symptoms. Discussed with patient the possibility that this may not be due to an infection but a change of her normal vaginal flora.  - Discussed with patient to RTC if symptoms continue. She refused STD testing today, advised that this is an option for the future.    Marco Collie, PA-C  [facility] 10/04/2016 5:43 PM

## 2017-03-25 ENCOUNTER — Ambulatory Visit: Payer: BLUE CROSS/BLUE SHIELD

## 2017-04-24 ENCOUNTER — Emergency Department (HOSPITAL_BASED_OUTPATIENT_CLINIC_OR_DEPARTMENT_OTHER)
Admission: EM | Admit: 2017-04-24 | Discharge: 2017-04-24 | Disposition: A | Discharge status: Home / Self Care | Payer: BLUE CROSS/BLUE SHIELD | Attending: Physician Assistant | Admitting: Physician Assistant

## 2017-04-24 ENCOUNTER — Encounter (HOSPITAL_BASED_OUTPATIENT_CLINIC_OR_DEPARTMENT_OTHER): Payer: Self-pay

## 2017-04-24 DIAGNOSIS — R55 Syncope and collapse: Secondary | ICD-10-CM | POA: Insufficient documentation

## 2017-04-24 LAB — URINALYSIS, ROUTINE W REFLEX MICROSCOPIC
Bilirubin Urine: NEGATIVE
GLUCOSE, UA: NEGATIVE mg/dL
HGB URINE DIPSTICK: NEGATIVE
Ketones, ur: 15 mg/dL — AB
Leukocytes, UA: NEGATIVE
Nitrite: NEGATIVE
Protein, ur: NEGATIVE mg/dL
SPECIFIC GRAVITY, URINE: 1.009 (ref 1.005–1.030)
pH: 7 (ref 5.0–8.0)

## 2017-04-24 LAB — COMPREHENSIVE METABOLIC PANEL
ALBUMIN: 3.9 g/dL (ref 3.5–5.0)
ALT: 16 U/L (ref 14–54)
ANION GAP: 8 (ref 5–15)
AST: 22 U/L (ref 15–41)
Alkaline Phosphatase: 52 U/L (ref 38–126)
BUN: 12 mg/dL (ref 6–20)
CO2: 26 mmol/L (ref 22–32)
Calcium: 9.2 mg/dL (ref 8.9–10.3)
Chloride: 99 mmol/L — ABNORMAL LOW (ref 101–111)
Creatinine, Ser: 1.1 mg/dL — ABNORMAL HIGH (ref 0.44–1.00)
GFR calc non Af Amer: >60 mL/min (ref >60)
GLUCOSE: 100 mg/dL — AB (ref 65–99)
POTASSIUM: 4 mmol/L (ref 3.5–5.1)
SODIUM: 133 mmol/L — AB (ref 135–145)
Total Bilirubin: 0.6 mg/dL (ref 0.3–1.2)
Total Protein: 7.5 g/dL (ref 6.5–8.1)

## 2017-04-24 LAB — CBC WITH DIFFERENTIAL/PLATELET
BASOS PCT: 0 %
Basophils Absolute: 0 10*3/uL (ref 0.0–0.1)
EOS ABS: 0.1 10*3/uL (ref 0.0–0.7)
Eosinophils Relative: 1 %
HCT: 35.5 % — ABNORMAL LOW (ref 36.0–46.0)
Hemoglobin: 11.8 g/dL — ABNORMAL LOW (ref 12.0–15.0)
Lymphocytes Relative: 15 %
Lymphs Abs: 1.6 10*3/uL (ref 0.7–4.0)
MCH: 27.4 pg (ref 26.0–34.0)
MCHC: 33.2 g/dL (ref 30.0–36.0)
MCV: 82.4 fL (ref 78.0–100.0)
MONO ABS: 0.6 10*3/uL (ref 0.1–1.0)
MONOS PCT: 6 %
Neutro Abs: 8.2 10*3/uL — ABNORMAL HIGH (ref 1.7–7.7)
Neutrophils Relative %: 78 %
Platelets: 334 10*3/uL (ref 150–400)
RBC: 4.31 MIL/uL (ref 3.87–5.11)
RDW: 12.1 % (ref 11.5–15.5)
WBC: 10.5 10*3/uL (ref 4.0–10.5)

## 2017-04-24 LAB — PREGNANCY, URINE: PREG TEST UR: NEGATIVE

## 2017-04-24 MED ORDER — SODIUM CHLORIDE 0.9 % IV BOLUS (SEPSIS)
1000.0000 mL | Freq: Once | INTRAVENOUS | Status: AC
  Administered 2017-04-24: 1000 mL via INTRAVENOUS

## 2017-04-24 NOTE — ED Triage Notes (Signed)
Pt states she "felt like I was going to pass out" while seated at work approx 3pm-felt dizzy, light headed, sweating-denies pain-NAD-steady gait

## 2017-04-24 NOTE — Discharge Instructions (Signed)
We think is likely that you almost fainted today because you were not eating. Please sure to stay hydrated and eat regular meals.  Please follow up with your PCP.

## 2017-04-24 NOTE — ED Notes (Signed)
Ambulated around nursing station without assistance. Gait steady, tol well

## 2017-04-24 NOTE — ED Notes (Signed)
Pt on monitor 

## 2017-04-24 NOTE — ED Notes (Signed)
Eating snack and drinking fluids well. States," I'm feeling better"

## 2017-04-24 NOTE — ED Notes (Signed)
Given snacks and po fluids  

## 2017-04-24 NOTE — ED Notes (Signed)
Patient denies pain and is resting comfortably.  

## 2017-04-24 NOTE — ED Provider Notes (Signed)
[facility] Note   CSN: [id number] Arrival date & time: 04/24/17  1528     History   Chief Complaint Chief Complaint  Patient presents with  . Near Syncope    HPI Kristin Johnson is a 31 y.o. female.  HPI   31 year old female presenting with presyncope. Patient was at work today when she felt like she might pass out. She laid on the floor. She felt very sweaty and lightheaded. Patient had not eaten anything all day. Patient's glucose check prior to arrival and it was 70. Patient cannot eat because she was so busy today. Otherwise has been her normal state health no fevers or chills no nausea no vomiting no abdominal pain or vaginal complaints.  Past Medical History:  Diagnosis Date  . Anxiety   . Multiple sclerosis [facility])     Patient Active Problem List   Diagnosis Date Noted  . Post traumatic stress disorder (PTSD) 06/01/2015  . Multiple sclerosis (HCC) 03/30/2015    History reviewed. No pertinent surgical history.  OB History    No data available       Home Medications    Prior to Admission medications   Not on File    Family History No family history on file.  Social History Social History  Substance Use Topics  . Smoking status: Never Smoker  . Smokeless tobacco: Never Used  . Alcohol use Yes     Comment: weekly     Allergies   Patient has no known allergies.   Review of Systems Review of Systems  Constitutional: Negative for activity change.  Respiratory: Negative for shortness of breath.   Cardiovascular: Negative for chest pain.  Gastrointestinal: Negative for abdominal pain.  Neurological: Positive for light-headedness.  All other systems reviewed and are negative.    Physical Exam Updated Vital Signs BP 108/64   Pulse 67   Temp 98 F (36.7 C) (Oral)   Resp 16   Ht 5\' 6"  (1.676 m)   Wt 70.3 kg (155 lb)   LMP 04/14/2017   SpO2 (!) 88%   BMI 25.02 kg/m   Physical Exam  Constitutional: She is oriented  to person, place, and time. She appears well-developed and well-nourished.  HENT:  Head: Normocephalic and atraumatic.  Eyes: Right eye exhibits no discharge.  Cardiovascular: Normal rate, regular rhythm and normal heart sounds.   No murmur heard. Pulmonary/Chest: Effort normal and breath sounds normal. She has no wheezes. She has no rales.  Abdominal: Soft. She exhibits no distension. There is no tenderness.  Neurological: She is oriented to person, place, and time.  Skin: Skin is warm and dry. She is not diaphoretic.  Psychiatric: She has a normal mood and affect.  Nursing note and vitals reviewed.    ED Treatments / Results  Labs (all labs ordered are listed, but only abnormal results are displayed) Labs Reviewed  URINALYSIS, ROUTINE W REFLEX MICROSCOPIC - Abnormal; Notable for the following:       Result Value   Ketones, ur 15 (*)    All other components within normal limits  CBC WITH DIFFERENTIAL/PLATELET - Abnormal; Notable for the following:    Hemoglobin 11.8 (*)    HCT 35.5 (*)    Neutro Abs 8.2 (*)    All other components within normal limits  COMPREHENSIVE METABOLIC PANEL - Abnormal; Notable for the following:    Sodium 133 (*)    Chloride 99 (*)    Glucose, Bld 100 (*)  Creatinine, Ser 1.10 (*)    All other components within normal limits  PREGNANCY, URINE    EKG  EKG Interpretation  Date/Time:  Wednesday Apr 24 2017 16:00:39 EDT Ventricular Rate:  75 PR Interval:    QRS Duration: 85 QT Interval:  401 QTC Calculation: 448 R Axis:   76 Text Interpretation:  Normal sinus rhythm Confirmed by Corlis Leak, Courteney ([id number]) on 04/24/2017 4:11:55 PM       Radiology No results found.  Procedures Procedures (including critical care time)  Medications Ordered in ED Medications  sodium chloride 0.9 % bolus 1,000 mL (1,000 mLs Intravenous New Bag/Given 04/24/17 1621)     Initial Impression / Assessment and Plan / ED Course  I have reviewed the triage  vital signs and the nursing notes.  Pertinent labs & imaging results that were available during my care of the patient were reviewed by me and considered in my medical decision making (see chart for details).    Patient is very well appearing 31 year old female presenting with presyncope. Patient had not eaten anything today. Believe this is likely the causative factor. Patient otherwise has had no symptoms. Physical exam is reassuring. We will do labs and get EKG. We will feed patient make sure patient continues to feel well.  5:12 PM Normal vital signs. Patient ambulatory, feels much improved. We'll discharge her follow-up with PCP.  Final Clinical Impressions(s) / ED Diagnoses   Final diagnoses:  Near syncope    New Prescriptions New Prescriptions   No medications on file     Abelino Derrick, MD 04/24/17 1712

## 2017-05-25 ENCOUNTER — Ambulatory Visit: Payer: BLUE CROSS/BLUE SHIELD | Admitting: Physician Assistant

## 2017-06-15 ENCOUNTER — Ambulatory Visit (INDEPENDENT_AMBULATORY_CARE_PROVIDER_SITE_OTHER): Payer: BLUE CROSS/BLUE SHIELD | Admitting: Emergency Medicine

## 2017-06-15 ENCOUNTER — Encounter: Payer: Self-pay | Admitting: Emergency Medicine

## 2017-06-15 VITALS — BP 118/77 | HR 81 | Temp 98.0°F | Resp 16 | Ht 67.0 in | Wt 164.4 lb

## 2017-06-15 DIAGNOSIS — G35 Multiple sclerosis: Secondary | ICD-10-CM

## 2017-06-15 MED ORDER — PREDNISONE 20 MG PO TABS: 40.0000 mg | ORAL_TABLET | Freq: Every day | ORAL | 0 refills | Status: AC

## 2017-06-15 MED ORDER — METHYLPREDNISOLONE SODIUM SUCC 125 MG IJ SOLR
125.0000 mg | Freq: Once | INTRAMUSCULAR | Status: AC
  Administered 2017-06-15: 125 mg via INTRAMUSCULAR

## 2017-06-15 NOTE — Patient Instructions (Addendum)
     IF you received an x-ray today, you will receive an invoice from [facility]. Please contact [facility] at [phone] with questions or concerns regarding your invoice.   IF you received labwork today, you will receive an invoice from [facility]. Please contact [facility] at [phone] with questions or concerns regarding your invoice.   Our billing staff will not be able to assist you with questions regarding bills from these companies.  You will be contacted with the lab results as soon as they are available. The fastest way to get your results is to activate your My Chart account. Instructions are located on the last page of this paperwork. If you have not heard from us regarding the results in 2 weeks, please contact this office.    Multiple Sclerosis Multiple sclerosis (MS) is a disease of the central nervous system. It leads to the loss of the insulating covering of the nerves (myelin sheath) of your brain. When this happens, brain signals do not get sent properly or may not get sent at all. The age of onset of MS varies. What are the causes? The cause of MS is unknown. However, it is more common in the [place] than in the [place]. What increases the risk? There is a higher number of women with MS than men. MS is not an illness that is passed down to you from your family members (inherited). However, your risk of MS is higher if you have a relative with MS. What are the signs or symptoms? The symptoms of MS occur in episodes or attacks. These attacks may last weeks to months. There may be long periods of almost no symptoms between attacks. The symptoms of MS vary. This is because of the many different ways it affects the central nervous system. The main symptoms of MS include:  Vision problems and eye pain.  Numbness.  Weakness.  Inability to move your arms, hands, feet, or legs (paralysis).  Balance problems.  Tremors.  How  is this diagnosed? Your health care provider can diagnose MS with the help of imaging exams and lab tests. These may include specialized X-ray exams and spinal fluid tests. The best imaging exam to confirm a diagnosis of MS is an MRI. How is this treated? There is no known cure for MS, but there are medicines that can decrease the number and frequency of attacks. Steroids are often used for short-term relief. Physical and occupational therapy may also help. There are also many new alternative or complementary treatments available to help control the symptoms of MS. Ask your health care provider if any of these other options are right for you. Follow these instructions at home:  Take medicines as directed by your health care provider.  Exercise as directed by your health care provider. Contact a health care provider if: You begin to feel depressed. Get help right away if:  You develop paralysis.  You have problems with bladder, bowel, or sexual function.  You develop mental changes, such as forgetfulness or mood swings.  You have a period of uncontrolled movements (seizure). This information is not intended to replace advice given to you by your health care provider. Make sure you discuss any questions you have with your health care provider. Document Released: 11/16/2000 Document Revised: 04/26/2016 Document Reviewed: 07/27/2013 Elsevier Interactive Patient Education  2017 Elsevier Inc.  

## 2017-06-15 NOTE — Progress Notes (Signed)
Kristin Johnson 31 y.o.   Chief Complaint  Patient presents with  . Multiple Sclerosis    cant get appt with neurologist, disoriented 31 times every hour, talking difficulty, confusion, dizziness, spasams 2 weeks     HISTORY OF PRESENT ILLNESS: [name] with h/o MS, no tratment at present time, c/o 2 week h/o intermittent episodes of disorientation, dizziness, confusion, and speech difficulty. Able to function, drove herself here today. No other significant symptoms.  HPI   Prior to Admission medications   Medication Sig Start Date End Date Taking? Authorizing Provider  SYEDA 3-0.03 MG tablet  06/08/17   [provider]    No Known Allergies  Patient Active Problem List   Diagnosis Date Noted  . Post traumatic stress disorder (PTSD) 06/01/2015  . Multiple sclerosis (HCC) 03/30/2015    Past Medical History:  Diagnosis Date  . Anxiety   . Multiple sclerosis (HCC)     History reviewed. No pertinent surgical history.  Social History   Social History  . Marital status: Single    Spouse name: N/A  . Number of children: N/A  . Years of education: N/A   Occupational History  . Not on file.   Social History Main Topics  . Smoking status: Never Smoker  . Smokeless tobacco: Never Used  . Alcohol use Yes     Comment: weekly  . Drug use: No  . Sexual activity: Yes    Partners: Male    Birth control/ protection: Pill   Other Topics Concern  . Not on file   Social History Narrative  . No narrative on file    History reviewed. No pertinent family history.   Review of Systems  Constitutional: Negative for chills and fever.  HENT: Negative.  Negative for congestion, ear pain, hearing loss, nosebleeds and sore throat.   Eyes: Negative.  Negative for blurred vision, double vision and pain.  Respiratory: Negative.  Negative for cough, shortness of breath and wheezing.   Cardiovascular: Negative.  Negative for palpitations and leg swelling.   Gastrointestinal: Negative.  Negative for abdominal pain, diarrhea, nausea and vomiting.  Genitourinary: Negative.  Negative for dysuria.  Musculoskeletal: Negative.  Negative for back pain, myalgias and neck pain.  Skin: Negative.  Negative for rash.  Neurological: Positive for dizziness, speech change and weakness. Negative for sensory change, focal weakness, seizures, loss of consciousness and headaches.  Endo/Heme/Allergies: Negative.   All other systems reviewed and are negative.    Vitals:   06/15/17 1333  BP: 118/77  Pulse: 81  Resp: 16  Temp: 98 F (36.7 C)     Physical Exam  Constitutional: She is oriented to person, place, and time. She appears well-developed and well-nourished.  HENT:  Head: Normocephalic and atraumatic.  Right Ear: External ear normal.  Left Ear: External ear normal.  Nose: Nose normal.  Mouth/Throat: Oropharynx is clear and moist. No oropharyngeal exudate.  Eyes: Pupils are equal, round, and reactive to light. Conjunctivae and EOM are normal.  Neck: Normal range of motion. Neck supple. No JVD present. No thyromegaly present.  Cardiovascular: Normal rate, regular rhythm, normal heart sounds and intact distal pulses.   Pulmonary/Chest: Effort normal and breath sounds normal.  Abdominal: Soft. Bowel sounds are normal. She exhibits no distension. There is no tenderness.  Musculoskeletal: Normal range of motion.  Lymphadenopathy:    She has no cervical adenopathy.  Neurological: She is alert and oriented to person, place, and time. She displays normal  reflexes. No cranial nerve deficit or sensory deficit. She exhibits normal muscle tone. Coordination normal.  Skin: Skin is warm and dry. Capillary refill takes less than 2 seconds. No rash noted.  Psychiatric: She has a normal mood and affect. Her behavior is normal.  Vitals reviewed.     ASSESSMENT & PLAN: Caliana was seen today for multiple sclerosis.  Diagnoses and all orders for this  visit:  Multiple sclerosis (HCC) -     methylPREDNISolone sodium succinate (SOLU-MEDROL) 125 mg/2 mL injection 125 mg; Inject 2 mLs (125 mg total) into the muscle once. -     Ambulatory referral to Neurology  Other orders -     predniSONE (DELTASONE) 20 MG tablet; Take 2 tablets (40 mg total) by mouth daily with breakfast.   Patient Instructions       IF you received an x-ray today, you will receive an invoice from [facility]. Please contact [facility] at [phone] with questions or concerns regarding your invoice.   IF you received labwork today, you will receive an invoice from [place]. Please contact [facility] at [phone] with questions or concerns regarding your invoice.   Our billing staff will not be able to assist you with questions regarding bills from these companies.  You will be contacted with the lab results as soon as they are available. The fastest way to get your results is to activate your My Chart account. Instructions are located on the last page of this paperwork. If you have not heard from [place] regarding the results in 2 weeks, please contact this office.     Multiple Sclerosis Multiple sclerosis (MS) is a disease of the central nervous system. It leads to the loss of the insulating covering of the nerves (myelin sheath) of your brain. When this happens, brain signals do not get sent properly or may not get sent at all. The age of onset of MS varies. What are the causes? The cause of MS is unknown. However, it is more common in the [place] than in the [place]. What increases the risk? There is a higher number of women with MS than men. MS is not an illness that is passed down to you from your family members (inherited). However, your risk of MS is higher if you have a relative with MS. What are the signs or symptoms? The symptoms of MS occur in episodes or attacks. These attacks may last weeks to months. There  may be long periods of almost no symptoms between attacks. The symptoms of MS vary. This is because of the many different ways it affects the central nervous system. The main symptoms of MS include:  Vision problems and eye pain.  Numbness.  Weakness.  Inability to move your arms, hands, feet, or legs (paralysis).  Balance problems.  Tremors.  How is this diagnosed? Your health care provider can diagnose MS with the help of imaging exams and lab tests. These may include specialized X-ray exams and spinal fluid tests. The best imaging exam to confirm a diagnosis of MS is an MRI. How is this treated? There is no known cure for MS, but there are medicines that can decrease the number and frequency of attacks. Steroids are often used for short-term relief. Physical and occupational therapy may also help. There are also many new alternative or complementary treatments available to help control the symptoms of MS. Ask your health care provider if any of these other options are right for you. Follow  these instructions at home:  Take medicines as directed by your health care provider.  Exercise as directed by your health care provider. Contact a health care provider if: You begin to feel depressed. Get help right away if:  You develop paralysis.  You have problems with bladder, bowel, or sexual function.  You develop mental changes, such as forgetfulness or mood swings.  You have a period of uncontrolled movements (seizure). This information is not intended to replace advice given to you by your health care provider. Make sure you discuss any questions you have with your health care provider. Document Released: 11/16/2000 Document Revised: 04/26/2016 Document Reviewed: 07/27/2013 Elsevier Interactive Patient Education  2017 Elsevier Inc.      Edwina Barth, MD [facility]

## 2017-06-17 ENCOUNTER — Telehealth: Payer: Self-pay | Admitting: Emergency Medicine

## 2017-06-17 NOTE — Telephone Encounter (Signed)
PATIENT STATES SHE SAW DR. SAGARDIA Saturday (06/15/17) BECAUSE SHE HAS MS. SHE DID NOT THINK ABOUT GETTING A WORK NOTE. SHE IS NOT ABLE TO FUNCTION AT WORK BECAUSE SHE CAN'T KEEP HER BALANCE. 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 SHE WOULD LIKE TO GET A NOTE FOR HER JOB AND ALSO TALK WITH DR. SAGARDIA ABOUT GOING ON SHORT TERM DISABILITY. BEST PHONE [phone] (CELL) PLEASE FAX THE WORK NOTE TO HER AT [phone]. MBC

## 2017-06-18 NOTE — Telephone Encounter (Signed)
Please advise 

## 2017-06-19 ENCOUNTER — Ambulatory Visit: Payer: BLUE CROSS/BLUE SHIELD | Admitting: Emergency Medicine

## 2017-06-24 ENCOUNTER — Encounter: Payer: Self-pay | Admitting: Emergency Medicine

## 2017-06-24 ENCOUNTER — Ambulatory Visit (INDEPENDENT_AMBULATORY_CARE_PROVIDER_SITE_OTHER): Payer: BLUE CROSS/BLUE SHIELD | Admitting: Emergency Medicine

## 2017-06-24 ENCOUNTER — Telehealth: Payer: Self-pay | Admitting: Emergency Medicine

## 2017-06-24 ENCOUNTER — Ambulatory Visit
Admission: RE | Admit: 2017-06-24 | Discharge: 2017-06-24 | Disposition: A | Discharge status: Home / Self Care | Payer: BLUE CROSS/BLUE SHIELD | Source: Ambulatory Visit | Attending: Emergency Medicine | Admitting: Emergency Medicine

## 2017-06-24 VITALS — BP 94/62 | HR 89 | Temp 98.3°F | Resp 16 | Ht 66.0 in | Wt 161.2 lb

## 2017-06-24 DIAGNOSIS — G4459 Other complicated headache syndrome: Secondary | ICD-10-CM | POA: Diagnosis not present

## 2017-06-24 DIAGNOSIS — G939 Disorder of brain, unspecified: Secondary | ICD-10-CM | POA: Insufficient documentation

## 2017-06-24 DIAGNOSIS — G35 Multiple sclerosis: Secondary | ICD-10-CM | POA: Insufficient documentation

## 2017-06-24 DIAGNOSIS — G35D Multiple sclerosis, unspecified: Secondary | ICD-10-CM

## 2017-06-24 MED ORDER — BUTALBITAL-APAP-CAFFEINE 50-325-40 MG PO TABS: 1.0000 | ORAL_TABLET | Freq: Four times a day (QID) | ORAL | 0 refills | Status: DC | PRN

## 2017-06-24 MED ORDER — GADOBENATE DIMEGLUMINE 529 MG/ML IV SOLN
15.0000 mL | Freq: Once | INTRAVENOUS | Status: AC | PRN
  Administered 2017-06-24: 15 mL via INTRAVENOUS

## 2017-06-24 NOTE — Telephone Encounter (Signed)
Patient needs to get disability forms completed by Dr Alvy Bimler for her last OV with him in regards to her MS. I have completed what I could from the OV notes and highlighted the areas that need to be finished. I will place the forms in your box on 06/24/17 please return them to the FMLA/Disability box at the [address] checkout desk within 5-7 business days. Thank you!

## 2017-06-24 NOTE — Patient Instructions (Addendum)
Go to [facility] in [place] today at 1:00 pm and go to the radiology desk to register through the revolving doors. The appointment is at 2:00 pm    IF you received an x-ray today, you will receive an invoice from [facility]. Please contact [facility] at [phone] with questions or concerns regarding your invoice.   IF you received labwork today, you will receive an invoice from [facility]. Please contact [facility] at [phone] with questions or concerns regarding your invoice.   Our billing staff will not be able to assist you with questions regarding bills from these companies.  You will be contacted with the lab results as soon as they are available. The fastest way to get your results is to activate your My Chart account. Instructions are located on the last page of this paperwork. If you have not heard from [place] regarding the results in 2 weeks, please contact this office.

## 2017-06-24 NOTE — Progress Notes (Signed)
Kristin Johnson 31 y.o.   Chief Complaint  Patient presents with  . Altered Mental Status    X 1WEEK    HISTORY OF PRESENT ILLNESS: [name] with h/o MS, seen by me 7/14 with MS relapse; started on Prednisone after shot of Solumedrol; still having multiple symptoms; questionable seizure episodes where she blanks out and stares with period of confusion afterward; can't focus, can't remember things; having daily headaches. Referred to Neurology but not yet scheduled.   HPI   Prior to Admission medications   Medication Sig Start Date End Date Taking? Authorizing Provider  SYEDA 3-0.03 MG tablet  06/08/17  Yes [provider]    No Known Allergies  Patient Active Problem List   Diagnosis Date Noted  . Post traumatic stress disorder (PTSD) 06/01/2015  . Multiple sclerosis (HCC) 03/30/2015    Past Medical History:  Diagnosis Date  . Anxiety   . Multiple sclerosis (HCC)     No past surgical history on file.  Social History   Social History  . Marital status: Single    Spouse name: N/A  . Number of children: N/A  . Years of education: N/A   Occupational History  . Not on file.   Social History Main Topics  . Smoking status: Never Smoker  . Smokeless tobacco: Never Used  . Alcohol use Yes     Comment: weekly  . Drug use: No  . Sexual activity: Yes    Partners: Male    Birth control/ protection: Pill   Other Topics Concern  . Not on file   Social History Narrative  . No narrative on file    No family history on file.   Review of Systems  Constitutional: Negative.  Negative for chills and fever.  HENT: Negative.  Negative for ear pain, hearing loss, nosebleeds and sore throat.   Eyes: Negative.  Negative for blurred vision, double vision and redness.  Respiratory: Negative.  Negative for cough, shortness of breath and wheezing.   Cardiovascular: Negative.  Negative for chest pain, palpitations and leg swelling.  Gastrointestinal:  Negative.  Negative for abdominal pain, diarrhea, nausea and vomiting.  Genitourinary: Negative.  Negative for dysuria and hematuria.  Musculoskeletal: Negative.  Negative for myalgias and neck pain.  Skin: Negative.  Negative for rash.  Neurological: Positive for sensory change, speech change, seizures (possible) and headaches. Negative for dizziness and focal weakness.  All other systems reviewed and are negative.    Physical Exam  Constitutional: She is oriented to person, place, and time. She appears well-developed and well-nourished.  HENT:  Head: Normocephalic and atraumatic.  Right Ear: External ear normal.  Left Ear: External ear normal.  Nose: Nose normal.  Mouth/Throat: Oropharynx is clear and moist.  Eyes: Pupils are equal, round, and reactive to light. Conjunctivae and EOM are normal.  Neck: Normal range of motion. Neck supple. No JVD present. No thyromegaly present.  Cardiovascular: Normal rate, regular rhythm and normal heart sounds.   Pulmonary/Chest: Effort normal and breath sounds normal.  Abdominal: Soft. Bowel sounds are normal.  Musculoskeletal: Normal range of motion.  Lymphadenopathy:    She has no cervical adenopathy.  Neurological: She is alert and oriented to person, place, and time. She displays normal reflexes. No cranial nerve deficit or sensory deficit. She exhibits normal muscle tone. Coordination normal.  Skin: Skin is warm and dry. Capillary refill takes less than 2 seconds. No rash noted.  Psychiatric: She has a normal mood  and affect. Her behavior is normal.  Vitals reviewed.    ASSESSMENT & PLAN: Will arrange to have MRI of Brain done today. Kristin Johnson was seen today for altered mental status.  Diagnoses and all orders for this visit:  Multiple sclerosis exacerbation (HCC)  Multiple sclerosis (HCC) -     MR Brain W Wo Contrast; Future -     CBC with Differential/Platelet -     Comprehensive metabolic panel  Other complicated headache  syndrome -     butalbital-acetaminophen-caffeine (FIORICET, ESGIC) 50-325-40 MG tablet; Take 1 tablet by mouth every 6 (six) hours as needed for headache. -     CBC with Differential/Platelet -     Comprehensive metabolic panel   Patient Instructions   Go to [facility] in [place] today at 1:00 pm and go to the radiology desk to register through the revolving doors. The appointment is at 2:00 pm    IF you received an x-ray today, you will receive an invoice from [facility]. Please contact [facility] at [phone] with questions or concerns regarding your invoice.   IF you received labwork today, you will receive an invoice from [place]. Please contact [facility] at [phone] with questions or concerns regarding your invoice.   Our billing staff will not be able to assist you with questions regarding bills from these companies.  You will be contacted with the lab results as soon as they are available. The fastest way to get your results is to activate your My Chart account. Instructions are located on the last page of this paperwork. If you have not heard from [place] regarding the results in 2 weeks, please contact this office.     Mr Laqueta Jean Wo Contrast  Result Date: 06/24/2017 CLINICAL DATA:  Multiple sclerosis. New neurologic symptoms with migraine headaches, loss of cord nasion balance, and stuttering speech. Difficulty concentrating and comprehending. Lapses of judgement. Symptoms worse over the last 2 weeks. EXAM: MRI HEAD WITHOUT AND WITH CONTRAST TECHNIQUE: Multiplanar, multiecho pulse sequences of the brain and surrounding structures were obtained without and with intravenous contrast. CONTRAST:  15 mL MultiHance COMPARISON:  None. FINDINGS: Brain: Multiple periventricular T2 hyperintensities are associated with the corpus callosum, typical of a demyelinating process. A 7 mm focus is present within the right internal capsule. A 7 mm focus is  present superior and lateral to the atrium of the left lateral ventricle. There is no significant restricted diffusion associated with these lesions. A 6 mm lesion in the right frontal lobe on image 95 of series 13 enhances. There is subtle enhancement of the lesion along the right side of the fourth ventricle. A punctate focus is present in the right parietal lobe on image 105. A punctate focus of enhancement is evident in the subcortical left parietal lobe on image 111 of series 13 . A 4 mm enhancing lesion is noted in the subcortical right frontal lobe on image 118. Additional punctate subcortical focus of enhancement is present in the high right frontal lobe near the vertex on image 134. Vascular: Flow is present in the major intracranial arteries. Skull and upper cervical spine: The skullbase is within normal limits. The craniocervical junction is normal. The upper cervical spine is unremarkable. Sinuses/Orbits: The paranasal sinuses are clear. The mastoid air cells are clear. The globes and orbits are within normal limits. IMPRESSION: 1. Multiple T2 hyperintense lesions in a pattern compatible with multiple sclerosis. 2. At least 6 of these lesions enhance suggesting new lesions or  active demyelination. No restricted diffusion is present. 3. Prominent nonenhancing lesions are present in the right internal capsule and posterior and lateral to the atrium of the left lateral ventricle. 4. No acute or subacute infarct. Electronically Signed   By: Marin Roberts M.D.   On: 06/24/2017 15:38    1545: Results d/w patient. Advised to go directly to the ED for admission and Neurological consultation.    Edwina Barth, MD [facility]

## 2017-06-25 ENCOUNTER — Inpatient Hospital Stay (HOSPITAL_COMMUNITY): Payer: BLUE CROSS/BLUE SHIELD

## 2017-06-25 ENCOUNTER — Observation Stay (HOSPITAL_COMMUNITY)
Admission: EM | Admit: 2017-06-25 | Discharge: 2017-06-26 | Disposition: A | Discharge status: Home / Self Care | Payer: BLUE CROSS/BLUE SHIELD | Attending: Internal Medicine | Admitting: Internal Medicine

## 2017-06-25 ENCOUNTER — Encounter (HOSPITAL_COMMUNITY): Payer: Self-pay | Admitting: Emergency Medicine

## 2017-06-25 DIAGNOSIS — D649 Anemia, unspecified: Secondary | ICD-10-CM | POA: Diagnosis present

## 2017-06-25 DIAGNOSIS — R51 Headache: Secondary | ICD-10-CM

## 2017-06-25 DIAGNOSIS — D638 Anemia in other chronic diseases classified elsewhere: Secondary | ICD-10-CM | POA: Diagnosis not present

## 2017-06-25 DIAGNOSIS — B999 Unspecified infectious disease: Secondary | ICD-10-CM

## 2017-06-25 DIAGNOSIS — G35 Multiple sclerosis: Secondary | ICD-10-CM | POA: Diagnosis not present

## 2017-06-25 DIAGNOSIS — Z793 Long term (current) use of hormonal contraceptives: Secondary | ICD-10-CM | POA: Insufficient documentation

## 2017-06-25 DIAGNOSIS — G4459 Other complicated headache syndrome: Secondary | ICD-10-CM | POA: Diagnosis not present

## 2017-06-25 DIAGNOSIS — R519 Headache, unspecified: Secondary | ICD-10-CM | POA: Diagnosis present

## 2017-06-25 DIAGNOSIS — F419 Anxiety disorder, unspecified: Secondary | ICD-10-CM | POA: Diagnosis not present

## 2017-06-25 DIAGNOSIS — G939 Disorder of brain, unspecified: Secondary | ICD-10-CM

## 2017-06-25 LAB — COMPREHENSIVE METABOLIC PANEL
ALBUMIN: 3.7 g/dL (ref 3.5–5.0)
ALK PHOS: 61 IU/L (ref 39–117)
ALT: 10 IU/L (ref 0–32)
ALT: 12 U/L — ABNORMAL LOW (ref 14–54)
ANION GAP: 8 (ref 5–15)
AST: 11 IU/L (ref 0–40)
AST: 15 U/L (ref 15–41)
Albumin/Globulin Ratio: 1.4 (ref 1.2–2.2)
Albumin: 4.3 g/dL (ref 3.5–5.5)
Alkaline Phosphatase: 54 U/L (ref 38–126)
BILIRUBIN TOTAL: 0.3 mg/dL (ref 0.0–1.2)
BILIRUBIN TOTAL: 0.1 mg/dL — AB (ref 0.3–1.2)
BUN/Creatinine Ratio: 14 (ref 9–23)
BUN: 13 mg/dL (ref 6–20)
BUN: 16 mg/dL (ref 6–20)
CHLORIDE: 102 mmol/L (ref 96–106)
CHLORIDE: 104 mmol/L (ref 101–111)
CO2: 22 mmol/L (ref 20–29)
CO2: 25 mmol/L (ref 22–32)
Calcium: 9.6 mg/dL (ref 8.7–10.2)
Calcium: 9 mg/dL (ref 8.9–10.3)
Creatinine, Ser: 0.95 mg/dL (ref 0.57–1.00)
Creatinine, Ser: 0.87 mg/dL (ref 0.44–1.00)
GFR calc Af Amer: 93 mL/min/{1.73_m2} (ref >59)
GFR calc Af Amer: >60 mL/min (ref >60)
GFR calc non Af Amer: 81 mL/min/{1.73_m2} (ref >59)
GFR calc non Af Amer: >60 mL/min (ref >60)
GLUCOSE: 97 mg/dL (ref 65–99)
GLUCOSE: 104 mg/dL — AB (ref 65–99)
Globulin, Total: 3 g/dL (ref 1.5–4.5)
POTASSIUM: 3.9 mmol/L (ref 3.5–5.1)
Potassium: 4.6 mmol/L (ref 3.5–5.2)
Sodium: 139 mmol/L (ref 134–144)
Sodium: 137 mmol/L (ref 135–145)
TOTAL PROTEIN: 7.5 g/dL (ref 6.5–8.1)
Total Protein: 7.3 g/dL (ref 6.0–8.5)

## 2017-06-25 LAB — IRON AND TIBC
Iron: 78 ug/dL (ref 28–170)
Saturation Ratios: 18 % (ref 10.4–31.8)
TIBC: 433 ug/dL (ref 250–450)
UIBC: 355 ug/dL

## 2017-06-25 LAB — CBC WITH DIFFERENTIAL/PLATELET
BASOS ABS: 0 10*3/uL (ref 0.0–0.2)
BASOS: 0 %
EOS (ABSOLUTE): 0.3 10*3/uL (ref 0.0–0.4)
Eos: 4 %
Hematocrit: 37.5 % (ref 34.0–46.6)
Hemoglobin: 11.9 g/dL (ref 11.1–15.9)
IMMATURE GRANS (ABS): 0 10*3/uL (ref 0.0–0.1)
IMMATURE GRANULOCYTES: 0 %
LYMPHS: 20 %
Lymphocytes Absolute: 2 10*3/uL (ref 0.7–3.1)
MCH: 26.5 pg — AB (ref 26.6–33.0)
MCHC: 31.7 g/dL (ref 31.5–35.7)
MCV: 84 fL (ref 79–97)
Monocytes Absolute: 0.7 10*3/uL (ref 0.1–0.9)
Monocytes: 8 %
NEUTROS PCT: 68 %
Neutrophils Absolute: 6.7 10*3/uL (ref 1.4–7.0)
PLATELETS: 308 10*3/uL (ref 150–379)
RBC: 4.49 x10E6/uL (ref 3.77–5.28)
RDW: 13.3 % (ref 12.3–15.4)
WBC: 9.7 10*3/uL (ref 3.4–10.8)

## 2017-06-25 LAB — URINALYSIS, ROUTINE W REFLEX MICROSCOPIC
BILIRUBIN URINE: NEGATIVE
GLUCOSE, UA: NEGATIVE mg/dL
HGB URINE DIPSTICK: NEGATIVE
KETONES UR: NEGATIVE mg/dL
Leukocytes, UA: NEGATIVE
Nitrite: NEGATIVE
PROTEIN: NEGATIVE mg/dL
Specific Gravity, Urine: 1.012 (ref 1.005–1.030)
pH: 7 (ref 5.0–8.0)

## 2017-06-25 LAB — RAPID URINE DRUG SCREEN, HOSP PERFORMED
Amphetamines: NOT DETECTED
BENZODIAZEPINES: NOT DETECTED
Barbiturates: NOT DETECTED
Cocaine: NOT DETECTED
Opiates: NOT DETECTED
Tetrahydrocannabinol: NOT DETECTED

## 2017-06-25 LAB — CBC
HEMATOCRIT: 34.2 % — AB (ref 36.0–46.0)
Hemoglobin: 11.1 g/dL — ABNORMAL LOW (ref 12.0–15.0)
MCH: 26.5 pg (ref 26.0–34.0)
MCHC: 32.5 g/dL (ref 30.0–36.0)
MCV: 81.6 fL (ref 78.0–100.0)
PLATELETS: 303 10*3/uL (ref 150–400)
RBC: 4.19 MIL/uL (ref 3.87–5.11)
RDW: 13.1 % (ref 11.5–15.5)
WBC: 10.5 10*3/uL (ref 4.0–10.5)

## 2017-06-25 LAB — RETICULOCYTES
RBC.: 4.17 MIL/uL (ref 3.87–5.11)
RETIC CT PCT: 1.6 % (ref 0.4–3.1)
Retic Count, Absolute: 66.7 10*3/uL (ref 19.0–186.0)

## 2017-06-25 LAB — FOLATE: FOLATE: 19.7 ng/mL (ref >5.9)

## 2017-06-25 LAB — ETHANOL

## 2017-06-25 LAB — GLUCOSE, CAPILLARY: Glucose-Capillary: 211 mg/dL — ABNORMAL HIGH (ref 65–99)

## 2017-06-25 LAB — ACETAMINOPHEN LEVEL

## 2017-06-25 LAB — POC URINE PREG, ED: Preg Test, Ur: NEGATIVE

## 2017-06-25 LAB — SALICYLATE LEVEL: Salicylate Lvl: <7 mg/dL (ref 2.8–30.0)

## 2017-06-25 LAB — VITAMIN B12: Vitamin B-12: 324 pg/mL (ref 180–914)

## 2017-06-25 LAB — FERRITIN: FERRITIN: 34 ng/mL (ref 11–307)

## 2017-06-25 MED ORDER — SORBITOL 70 % SOLN
30.0000 mL | Freq: Every day | Status: DC | PRN
  Filled 2017-06-25: qty 30

## 2017-06-25 MED ORDER — SENNOSIDES-DOCUSATE SODIUM 8.6-50 MG PO TABS: 1.0000 | ORAL_TABLET | Freq: Every evening | ORAL | Status: DC | PRN

## 2017-06-25 MED ORDER — LORAZEPAM 0.5 MG PO TABS: 0.5000 mg | ORAL_TABLET | Freq: Two times a day (BID) | ORAL | Status: DC | PRN

## 2017-06-25 MED ORDER — ACETAMINOPHEN 650 MG RE SUPP: 650.0000 mg | Freq: Four times a day (QID) | RECTAL | Status: DC | PRN

## 2017-06-25 MED ORDER — ACETAMINOPHEN 325 MG PO TABS: 650.0000 mg | ORAL_TABLET | Freq: Four times a day (QID) | ORAL | Status: DC | PRN

## 2017-06-25 MED ORDER — ONDANSETRON HCL 4 MG PO TABS: 4.0000 mg | ORAL_TABLET | Freq: Four times a day (QID) | ORAL | Status: DC | PRN

## 2017-06-25 MED ORDER — ENOXAPARIN SODIUM 40 MG/0.4ML ~~LOC~~ SOLN
40.0000 mg | SUBCUTANEOUS | Status: DC
  Administered 2017-06-25: 40 mg via SUBCUTANEOUS
  Filled 2017-06-25: qty 0.4

## 2017-06-25 MED ORDER — BUTALBITAL-APAP-CAFFEINE 50-325-40 MG PO TABS: 1.0000 | ORAL_TABLET | Freq: Four times a day (QID) | ORAL | Status: DC | PRN

## 2017-06-25 MED ORDER — SODIUM CHLORIDE 0.9% FLUSH
3.0000 mL | Freq: Two times a day (BID) | INTRAVENOUS | Status: DC
  Administered 2017-06-25 – 2017-06-26 (×3): 3 mL via INTRAVENOUS

## 2017-06-25 MED ORDER — SODIUM CHLORIDE 0.9 % IV SOLN
INTRAVENOUS | Status: AC
  Administered 2017-06-25 (×2): via INTRAVENOUS

## 2017-06-25 MED ORDER — MAGNESIUM CITRATE PO SOLN: 1.0000 | Freq: Once | ORAL | Status: DC | PRN

## 2017-06-25 MED ORDER — TRAMADOL HCL 50 MG PO TABS: 100.0000 mg | ORAL_TABLET | Freq: Four times a day (QID) | ORAL | Status: DC | PRN

## 2017-06-25 MED ORDER — KETOROLAC TROMETHAMINE 30 MG/ML IJ SOLN
30.0000 mg | Freq: Four times a day (QID) | INTRAMUSCULAR | Status: DC | PRN
  Administered 2017-06-25: 30 mg via INTRAVENOUS
  Filled 2017-06-25: qty 1

## 2017-06-25 MED ORDER — SODIUM CHLORIDE 0.9 % IV SOLN
1000.0000 mg | Freq: Every day | INTRAVENOUS | Status: DC
  Administered 2017-06-25 – 2017-06-26 (×2): 1000 mg via INTRAVENOUS
  Filled 2017-06-25 (×2): qty 8

## 2017-06-25 MED ORDER — DROSPIRENONE-ETHINYL ESTRADIOL 3-0.03 MG PO TABS: 1.0000 | ORAL_TABLET | Freq: Every day | ORAL | Status: DC

## 2017-06-25 MED ORDER — ONDANSETRON HCL 4 MG/2ML IJ SOLN: 4.0000 mg | Freq: Four times a day (QID) | INTRAMUSCULAR | Status: DC | PRN

## 2017-06-25 NOTE — H&P (Addendum)
History and Physical    Kristin Johnson ZOX:[id number] DOB: January 24, 1986 DOA: 06/25/2017  PCP: Morrell Riddle, PA-C  Patient coming from: Home  I have personally briefly reviewed patient's old medical records in [facility]  Chief Complaint: Abnormal MRI results.  HPI: Kristin Johnson is a 30 y.o. female with medical history significant of anxiety, multiple sclerosis diagnosed in July 2011 presented to the ED with a 2 week history of worsening dizziness, seizure-like activity with body stiffening lasting a few seconds, decreased concentration, imbalance, headaches. Patient denied any fevers, no chills, no nausea, no vomiting, no abdominal pain, no chest pain, no diarrhea, no constipation, no melena, no hematemesis, no hematochezia, no cough, no diplopia, no asymmetric weakness or numbness. Patient stated so PCP on July 14 where she received a cortisone shot. Patient subsequently saw her PCP the day prior to admission MRI was ordered and patient subsequently called to present to the ED due to abnormal MRI results. Patient states that about 4-5 days prior to admission did have some dangerous thoughts that she did not act on. Patient stated that she wanted what will happen if she had set her comfortable 5 and also at one point also wanted what would happen if she stabbed herself in the eye. Patient denies any of these further thoughts since 4-5 days prior to admission. Patient denies any current suicidal ideation. Patient denies any current homicidal ideation.   ED Course: Patient seen in the ED, comprehensive metabolic profile obtained unremarkable. CBC obtained with a hemoglobin of 11.1 otherwise is within normal limits. Tylenol level was less than 10. Salicylate level was less than 7. Urine pregnancy was negative. Alcohol level was less than 5. UDS was negative. ED physician spoke with neuro hospitalists and recommendation was made for patient to be cancer to [facility] for further evaluation  by neuro hospitalists. Patient given a dose of Solu-Medrol 1 g IV 1 in the ED.  Review of Systems: As per HPI otherwise 10 point review of systems negative.   Past Medical History:  Diagnosis Date  . Anxiety   . Multiple sclerosis (HCC)     History reviewed. No pertinent surgical history.   reports that she has never smoked. She has never used smokeless tobacco. She reports that she drinks alcohol. She reports that she does not use drugs.  No Known Allergies  Family History  Problem Relation Age of Onset  . Lung cancer Mother   . Heart attack Father   . Heart failure Father   . Kidney Stones Father    Mother deceased age 59 from lung cancer. Father alive age 83 with a history of an enlarged aorta, kidney stones, CHF, history of MI 3 per patient.  Prior to Admission medications   Medication Sig Start Date End Date Taking? Authorizing Provider  SYEDA 3-0.03 MG tablet  06/08/17  Yes [provider]  butalbital-acetaminophen-caffeine (FIORICET, ESGIC) [phone] MG tablet Take 1 tablet by mouth every 6 (six) hours as needed for headache. 06/24/17 06/24/18  Georgina Quint, MD    Physical Exam: Vitals:   06/25/17 8295 06/25/17 0630 06/25/17 0813 06/25/17 0914  BP: 116/77 116/77 116/77 111/60  Pulse: 79 65 60 84  Resp:  16 15 16   Temp: 97.9 F (36.6 C) 97.9 F (36.6 C) 98.3 F (36.8 C) 97.9 F (36.6 C)  TempSrc: Oral Oral Oral Oral  SpO2: 100% 100% 100% 99%  Weight:      Height:  Constitutional: NAD, calm, comfortable Vitals:   06/25/17 0626 06/25/17 0630 06/25/17 0813 06/25/17 0914  BP: 116/77 116/77 116/77 111/60  Pulse: 79 65 60 84  Resp:  16 15 16   Temp: 97.9 F (36.6 C) 97.9 F (36.6 C) 98.3 F (36.8 C) 97.9 F (36.6 C)  TempSrc: Oral Oral Oral Oral  SpO2: 100% 100% 100% 99%  Weight:      Height:       Eyes: PERRLA, lids and conjunctivae normal ENMT: Mucous membranes are dry. Posterior pharynx clear of any exudate or lesions.Normal  dentition.  Neck: normal, supple, no masses, no thyromegaly Respiratory: clear to auscultation bilaterally, no wheezing, no crackles. Normal respiratory effort. No accessory muscle use.  Cardiovascular: Regular rate and rhythm, no murmurs / rubs / gallops. No extremity edema. 2+ pedal pulses. No carotid bruits.  Abdomen: no tenderness, no masses palpated. No hepatosplenomegaly. Bowel sounds positive.  Musculoskeletal: no clubbing / cyanosis. No joint deformity upper and lower extremities. Good ROM, no contractures. Normal muscle tone.  Skin: no rashes, lesions, ulcers. No induration Neurologic: CN 2-12 grossly intact. Sensation intact, DTR normal. Strength 5/5 in all 4.  Psychiatric: Normal judgment and insight. Alert and oriented x 3. Normal mood.    Labs on Admission: I have personally reviewed following labs and imaging studies  CBC:  Recent Labs Lab 06/24/17 0940 06/25/17 0225  WBC 9.7 10.5  NEUTROABS 6.7  --   HGB 11.9 11.1*  HCT 37.5 34.2*  MCV 84 81.6  PLT 308 303   Basic Metabolic Panel:  Recent Labs Lab 06/24/17 0940 06/25/17 0225  NA 139 137  K 4.6 3.9  CL 102 104  CO2 22 25  GLUCOSE 97 104*  BUN 13 16  CREATININE 0.95 0.87  CALCIUM 9.6 9.0   GFR: Estimated Creatinine Clearance: 96.7 mL/min (by C-G formula based on SCr of 0.87 mg/dL). Liver Function Tests:  Recent Labs Lab 06/24/17 0940 06/25/17 0225  AST 11 15  ALT 10 12*  ALKPHOS 61 54  BILITOT 0.3 0.1*  PROT 7.3 7.5  ALBUMIN 4.3 3.7   No results for input(s): LIPASE, AMYLASE in the last 168 hours. No results for input(s): AMMONIA in the last 168 hours. Coagulation Profile: No results for input(s): INR, PROTIME in the last 168 hours. Cardiac Enzymes: No results for input(s): CKTOTAL, CKMB, CKMBINDEX, TROPONINI in the last 168 hours. BNP (last 3 results) No results for input(s): PROBNP in the last 8760 hours. HbA1C: No results for input(s): HGBA1C in the last 72 hours. CBG: No results for  input(s): GLUCAP in the last 168 hours. Lipid Profile: No results for input(s): CHOL, HDL, LDLCALC, TRIG, CHOLHDL, LDLDIRECT in the last 72 hours. Thyroid Function Tests: No results for input(s): TSH, T4TOTAL, FREET4, T3FREE, THYROIDAB in the last 72 hours. Anemia Panel:  Recent Labs  06/25/17 0221  RETICCTPCT 1.6   Urine analysis:    Component Value Date/Time   COLORURINE YELLOW 04/24/2017 1547   APPEARANCEUR CLEAR 04/24/2017 1547   LABSPEC 1.009 04/24/2017 1547   PHURINE 7.0 04/24/2017 1547   GLUCOSEU NEGATIVE 04/24/2017 1547   HGBUR NEGATIVE 04/24/2017 1547   BILIRUBINUR NEGATIVE 04/24/2017 1547   BILIRUBINUR negative 07/07/2016 1151   BILIRUBINUR negative 03/30/2015 0843   KETONESUR 15 (A) 04/24/2017 1547   PROTEINUR NEGATIVE 04/24/2017 1547   UROBILINOGEN 0.2 07/07/2016 1151   NITRITE NEGATIVE 04/24/2017 1547   LEUKOCYTESUR NEGATIVE 04/24/2017 1547    Radiological Exams on Admission: Mr Laqueta Jean ZY Contrast  Result  Date: 06/24/2017 CLINICAL DATA:  Multiple sclerosis. New neurologic symptoms with migraine headaches, loss of cord nasion balance, and stuttering speech. Difficulty concentrating and comprehending. Lapses of judgement. Symptoms worse over the last 2 weeks. EXAM: MRI HEAD WITHOUT AND WITH CONTRAST TECHNIQUE: Multiplanar, multiecho pulse sequences of the brain and surrounding structures were obtained without and with intravenous contrast. CONTRAST:  15 mL MultiHance COMPARISON:  None. FINDINGS: Brain: Multiple periventricular T2 hyperintensities are associated with the corpus callosum, typical of a demyelinating process. A 7 mm focus is present within the right internal capsule. A 7 mm focus is present superior and lateral to the atrium of the left lateral ventricle. There is no significant restricted diffusion associated with these lesions. A 6 mm lesion in the right frontal lobe on image 95 of series 13 enhances. There is subtle enhancement of the lesion along the  right side of the fourth ventricle. A punctate focus is present in the right parietal lobe on image 105. A punctate focus of enhancement is evident in the subcortical left parietal lobe on image 111 of series 13 . A 4 mm enhancing lesion is noted in the subcortical right frontal lobe on image 118. Additional punctate subcortical focus of enhancement is present in the high right frontal lobe near the vertex on image 134. Vascular: Flow is present in the major intracranial arteries. Skull and upper cervical spine: The skullbase is within normal limits. The craniocervical junction is normal. The upper cervical spine is unremarkable. Sinuses/Orbits: The paranasal sinuses are clear. The mastoid air cells are clear. The globes and orbits are within normal limits. IMPRESSION: 1. Multiple T2 hyperintense lesions in a pattern compatible with multiple sclerosis. 2. At least 6 of these lesions enhance suggesting new lesions or active demyelination. No restricted diffusion is present. 3. Prominent nonenhancing lesions are present in the right internal capsule and posterior and lateral to the atrium of the left lateral ventricle. 4. No acute or subacute infarct. Electronically Signed   By: Marin Roberts M.D.   On: 06/24/2017 15:38    EKG: Not done.  Assessment/Plan Principal Problem:   Multiple sclerosis exacerbation (HCC) Active Problems:   Other complicated headache syndrome   Anxiety   Anemia   Headache   #1 multiple sclerosis exacerbation Patient with a history of multiple sclerosis diagnosed July 2011 presenting with 2 week history of worsening dizziness, seizure-like activity per patient, decreased concentration, imbalance. MRI of the head obtained prior to presentation in the ED per PCP with multiple T2 hyperintense lesions in a pattern compatible with MS. At least 6 of these lesions enhance suggesting new lesions or active demyelination. No restricted diffusion is present. Prominent nonenhancing  lesions are present in the right internal capsule and posterior and lateral to the HMO the left lateral ventricle. No acute or subacute infarct. Check an EEG as patient did have some complaints of seizure-like activity.  Neuro hospitalists consulted and patient currently to be transferred to [facility] for further evaluation by neuro hospitalists. Will place patient on Solu-Medrol 1 g IV daily 5 days. Patient has an outpatient appointment set up to be seen by Dr. Epimenio Foot neurology on 07/01/2017 which hopefully patient will be able to make.  #2 anxiety Stable. Ativan as needed.  #3 headaches Fioricet as needed.  #4 anemia Patient with no overt bleeding. Likely iron deficiency anemia. Check an anemia panel. Follow H&H.   DVT prophylaxis: Lovenox Code Status: Full Family Communication: Updated patient. No family at bedside. Disposition Plan: Home after  5 days of IV Solu-Medrol and if clinically improved. Consults called: Neurohospitalists Admission status: Admit to inpatient at [facility] MD [facility] Pager [phone]  If 7PM-7AM, please contact night-coverage [email or web address] Password [facility]  06/25/2017, 9:44 AM

## 2017-06-25 NOTE — Evaluation (Signed)
OT Cancellation Note  Patient Details Name: Elliette Thibodaux MRN: [id number] DOB: [birth year]   Cancelled Treatment:    Reason Eval/Treat Not Completed: Patient at procedure or test/ unavailable; will check back as schedule permits.  Marcy Siren, OT Pager [phone] 06/25/2017   Orlando Penner 06/25/2017, 3:24 PM

## 2017-06-25 NOTE — ED Triage Notes (Addendum)
Pt reports she had MRI done on 06/24/17 and was told to go to the emergency department due to results. MRI was done at 1400 on 06/24/17. Pt reports that she had been having issues with dizziness and seizures that initated order for MRI. Pt in no acute distress at this time. Pt also report having suicidal thoughts but has not acted on thoughts.

## 2017-06-25 NOTE — Consult Note (Addendum)
NEURO HOSPITALIST CONSULT NOTE   Requestig physician: Dr. Grandville Silos   Reason for Consult: MS flare   History obtained from:  Patient     HPI:                                                                                                                                          Kristin Johnson is an 31 y.o. female who was diagnosed with MS multiple years ago in [place] (2011). She [place] Dr. [place] of [place] and took Copaxone for a while before discontinuing int. Currently she is on no disease modifying therapy. Patient presented to the ED with a 2 weeks history of worsening dizziness, "seizure-like" sensation, difficulty with concentration and a feeling of imbalance. At times patient states that she had difficulty getting her words out but this only lasted for a few seconds. Patient did have a MRI that was obtained in a another facility however the reading suggested Lesions of active demyelination and at least 6 of these lesions are enhancing with the administration of gadolinium. For this reason patient was transferred to [facility] for further evaluation in 5 days of Solu-Medrol. Currently patient states that she still feels as though she is off balance when she stands up, intermittently having difficulty with word finding, intermittently having sensations of dizziness. Patient currently does not have an outpatient neurologist and will need to be set up with outpatient neurology on discharge. She has stated that at this point she is willing to go on medication as she is now had a second flareup. She does not work at this time. She does not live alone but does live with her boyfriend.  Past Medical History:  Diagnosis Date  . Anxiety   . Multiple sclerosis ([place])     History reviewed. No pertinent surgical history.  Family History  Problem Relation Age of Onset  . Lung cancer Mother   . Heart attack Father   . Heart failure Father   . Kidney Stones Father     Social History:  reports that she has never smoked. She has never used smokeless tobacco. She reports that she drinks alcohol. She reports that she does not use drugs.  No Known Allergies  MEDICATIONS:  Prior to Admission:  Prescriptions Prior to Admission  Medication Sig Dispense Refill Last Dose  . SYEDA 3-0.03 MG tablet    06/24/2017 at Unknown time  . butalbital-acetaminophen-caffeine (FIORICET, ESGIC) 50-325-40 MG tablet Take 1 tablet by mouth every 6 (six) hours as needed for headache. 20 tablet 0 not started   Scheduled: . drospirenone-ethinyl estradiol  1 tablet Oral Daily  . enoxaparin (LOVENOX) injection  40 mg Subcutaneous Q24H  . sodium chloride flush  3 mL Intravenous Q12H    ROS:                                                                                                                                       History obtained from the patient  General ROS: negative for - chills, fatigue, fever, night sweats, weight gain or weight loss Psychological ROS: negative for - behavioral disorder, hallucinations, memory difficulties, mood swings or suicidal ideation Ophthalmic ROS: negative for - blurry vision, double vision, eye pain or loss of vision ENT ROS: negative for - epistaxis, nasal discharge, oral lesions, sore throat, tinnitus or vertigo Allergy and Immunology ROS: negative for - hives or itchy/watery eyes Hematological and Lymphatic ROS: negative for - bleeding problems, bruising or swollen lymph nodes Endocrine ROS: negative for - galactorrhea, hair pattern changes, polydipsia/polyuria or temperature intolerance Respiratory ROS: negative for - cough, hemoptysis, shortness of breath or wheezing Cardiovascular ROS: negative for - chest pain, dyspnea on exertion, edema or irregular heartbeat Gastrointestinal ROS: negative for - abdominal pain, diarrhea,  hematemesis, nausea/vomiting or stool incontinence Genito-Urinary ROS: negative for - dysuria, hematuria, incontinence or urinary frequency/urgency Musculoskeletal ROS: negative for - joint swelling or muscular weakness Neurological ROS: as noted in HPI Dermatological ROS: negative for rash and skin lesion changes   Blood pressure (!) 105/58, pulse 77, temperature 98 F (36.7 C), temperature source Oral, resp. rate 16, height '5\' 6"'  (1.676 m), weight 73 kg (161 lb), last menstrual period 05/30/2017, SpO2 99 %.   Neurologic Examination:                                                                                                      HEENT-  Normocephalic, no lesions, without obvious abnormality.  Normal external eye and conjunctiva.  Normal TM's bilaterally.  Normal auditory canals and external ears. Normal external nose, mucus membranes and septum.  Normal pharynx. Cardiovascular- S1, S2 normal, pulses palpable throughout   Lungs- chest clear, no wheezing, rales, normal symmetric air entry Abdomen- normal  findings: bowel sounds normal Extremities- no edema Lymph-no adenopathy palpable Musculoskeletal-no joint tenderness, deformity or swelling Skin-warm and dry, no hyperpigmentation, vitiligo, or suspicious lesions  Neurological Examination Mental Status: Alert, oriented, thought content appropriate.  Speech fluent without evidence of aphasia.  Able to follow 3 step commands without difficulty. Cranial Nerves: II:  Visual fields grossly normal,  III,IV, VI: ptosis not present, extra-ocular motions intact bilaterally pupils equal, round, reactive to light and accommodation V,VII: smile symmetric, facial light touch sensation normal bilaterally VIII: hearing normal bilaterally IX,X: uvula rises symmetrically XI: bilateral shoulder shrug XII: midline tongue extension Motor: Right : Upper extremity   5/5    Left:     Upper extremity   5/5  Lower extremity   5/5     Lower extremity    4/5 Tone and bulk:normal tone throughout; no atrophy noted Sensory: Pinprick and light touch intact throughout, bilaterally Deep Tendon Reflexes: 2+ in the upper extremities, 3+ in the lower extremities at the knees, 2+ ankle jerks bilaterally  Plantars: Right: downgoing   Left: downgoing Cerebellar: normal finger-to-nose,  and normal heel-to-shin test Gait: normal gait and station   Lab Results: Basic Metabolic Panel:  Recent Labs Lab 06/24/17 0940 06/25/17 0225  NA 139 137  K 4.6 3.9  CL 102 104  CO2 22 25  GLUCOSE 97 104*  BUN 13 16  CREATININE 0.95 0.87  CALCIUM 9.6 9.0    Liver Function Tests:  Recent Labs Lab 06/24/17 0940 06/25/17 0225  AST 11 15  ALT 10 12*  ALKPHOS 61 54  BILITOT 0.3 0.1*  PROT 7.3 7.5  ALBUMIN 4.3 3.7   CBC:  Recent Labs Lab 06/24/17 0940 06/25/17 0225  WBC 9.7 10.5  NEUTROABS 6.7  --   HGB 11.9 11.1*  HCT 37.5 34.2*  MCV 84 81.6  PLT 308 303   Imaging:Reviewed. Official REPORT below. Brain MRI with and without contrast shows multiple areas of T2 hyperintensity, oval to round lesions, a lot of them are periventricular and some of them are subcortical and juxtacortical with Renato Battles finger pattern appearance on many of those lesions. Multiple lesions show enhancement with the administration of gadolinium, indicating active inflammation/demyelination.  Mr Jeri Cos Wo Contrast  Result Date: 06/24/2017 CLINICAL DATA:  Multiple sclerosis. New neurologic symptoms with migraine headaches, loss of cord nasion balance, and stuttering speech. Difficulty concentrating and comprehending. Lapses of judgement. Symptoms worse over the last 2 weeks. EXAM: MRI HEAD WITHOUT AND WITH CONTRAST TECHNIQUE: Multiplanar, multiecho pulse sequences of the brain and surrounding structures were obtained without and with intravenous contrast. CONTRAST:  15 mL MultiHance COMPARISON:  None. FINDINGS: Brain: Multiple periventricular T2 hyperintensities are  associated with the corpus callosum, typical of a demyelinating process. A 7 mm focus is present within the right internal capsule. A 7 mm focus is present superior and lateral to the atrium of the left lateral ventricle. There is no significant restricted diffusion associated with these lesions. A 6 mm lesion in the right frontal lobe on image 95 of series 13 enhances. There is subtle enhancement of the lesion along the right side of the fourth ventricle. A punctate focus is present in the right parietal lobe on image 105. A punctate focus of enhancement is evident in the subcortical left parietal lobe on image 111 of series 13 . A 4 mm enhancing lesion is noted in the subcortical right frontal lobe on image 118. Additional punctate subcortical focus of enhancement is present in the high right  frontal lobe near the vertex on image 134. Vascular: Flow is present in the major intracranial arteries. Skull and upper cervical spine: The skullbase is within normal limits. The craniocervical junction is normal. The upper cervical spine is unremarkable. Sinuses/Orbits: The paranasal sinuses are clear. The mastoid air cells are clear. The globes and orbits are within normal limits. IMPRESSION: 1. Multiple T2 hyperintense lesions in a pattern compatible with multiple sclerosis. 2. At least 6 of these lesions enhance suggesting new lesions or active demyelination. No restricted diffusion is present. 3. Prominent nonenhancing lesions are present in the right internal capsule and posterior and lateral to the atrium of the left lateral ventricle. 4. No acute or subacute infarct. Electronically Signed   By: San Morelle M.D.   On: 06/24/2017 15:38    Assessment and plan per attending neurologist  Etta Quill PA-C [facility] [phone]  06/25/2017, 11:31 AM   Assessment/Plan: 31 year old woman with a past medical history of multiple sclerosis, on no disease modifying agent presented for evaluation  of a myriad of neurological symptoms including dizziness, difficulty concentration and feeling off balance. She also complained of some word finding difficulty. Neuro imaging in the form of MRI of the brain with and without contrast is suggestive of active demyelination as evidenced by multiple enhancing lesions.  Impression Multiple sclerosis exacerbation Although she describes some abnormal sensations, seizures are not impossible but  less likely.  Recommend: 1-UA, CXR 2- EEG - has been ordered by the primary team, do not see the need for EEG as history is not classical for seizures or seizure-like activity and her symptoms can be explained by the active demyelinating process. I've taken the liberty to discontinue the EEG order. 3- IV Solu-Medrol 1000 mg x5 days. PT OT eval. If OK for discharge per PT OT, can get steroid infusions as outpatient if infusion center appt available.Marland Kitchen 4- we'll need outpatient referral to Dr. Rachel Moulds of [facility] neurology who is a MS specialist here in town. She has an appointment set for Monday 9 AM.   Neuro hospitalist addendum Patient seen and examined independently. Agree with the documented history, physical exam, review of systems. Note reviewed and edited. Agree with the assessment and plan as documented above. We will follow the patient with you.  Amie Portland, MD [facility] [phone]  If 7pm to 7am, please call on call as listed on AMION.   Addendum at 1:55 PM. Spoke to Dr. Maudie Mercury from the primary service. Discussed my assessment and plan. Please call neurology service with questions as needed. Final recommendations as above.

## 2017-06-25 NOTE — ED Provider Notes (Signed)
[facility]-EMERGENCY DEPT Provider Note   CSN: [id number] Arrival date & time: 06/25/17  0027     History   Chief Complaint Chief Complaint  Patient presents with  . abnormal MRI results  . Suicidal    HPI Kristin Johnson is a 31 y.o. female with history of anxiety multiple sclerosis who presents today with complaint of gradual onset, progressively worsening dizziness, difficulty concentrating, and seizure-like activity for 2 weeks. She states "I've been having a lot of difficulty concentrating and every once in a while I feel like I won't be able to move my body for a few seconds and this happens multiple times a day ". She feels these episodes are likely seizure-like activity. Denies vision changes, numbness, tingling, or weakness. Does endorse intermittent headaches. Denies chest pain, shortness of breath, abdominal pain, nausea, vomiting, diarrhea. No aggravating or alleviating factors noted. Patient went to see her primary care here who obtained MRI and told her to present to the ED today for admission into the hospital. She states she does not have a neurologist here and recently moved from [place]. States she did have a cortisone injection recently which has been helpful, however her symptoms persist. Triage note makes mention of suicidal ideation, however patient denies this at this time. She states "I've been having some weird thoughts for the past few days but I do not want to hurt myself or anybody else". She also denies auditory or visual hallucinations. No recent trauma or falls.  The history is provided by the patient.    Past Medical History:  Diagnosis Date  . Anxiety   . Multiple sclerosis [facility])     Patient Active Problem List   Diagnosis Date Noted  . Anxiety 06/25/2017  . Anemia 06/25/2017  . Headache 06/25/2017  . Other complicated headache syndrome 06/24/2017  . Post traumatic stress disorder (PTSD) 06/01/2015  . Multiple sclerosis exacerbation ([facility]) 03/30/2015     History reviewed. No pertinent surgical history.  OB History    No data available       Home Medications    Prior to Admission medications   Medication Sig Start Date End Date Taking? Authorizing Provider  SYEDA 3-0.03 MG tablet  06/08/17  Yes [provider]  butalbital-acetaminophen-caffeine (FIORICET, ESGIC) 40-981-19 MG tablet Take 1 tablet by mouth every 6 (six) hours as needed for headache. 06/24/17 06/24/18  Georgina Quint, MD    Family History Family History  Problem Relation Age of Onset  . Lung cancer Mother   . Heart attack Father   . Heart failure Father   . Kidney Stones Father     Social History Social History  Substance Use Topics  . Smoking status: Never Smoker  . Smokeless tobacco: Never Used  . Alcohol use Yes     Comment: weekly     Allergies   Patient has no known allergies.   Review of Systems Review of Systems  Constitutional: Negative for chills and fever.       Difficulty concentrating  Eyes: Negative for visual disturbance.  Respiratory: Negative for shortness of breath.   Cardiovascular: Negative for chest pain.  Gastrointestinal: Negative for abdominal pain, diarrhea, nausea and vomiting.  Genitourinary: Negative for dysuria and hematuria.  Musculoskeletal: Negative for back pain and neck pain.  Neurological: Positive for seizures and headaches. Negative for weakness and numbness.  Psychiatric/Behavioral: Positive for confusion. Negative for hallucinations and suicidal ideas.  All other systems reviewed and are negative.    Physical Exam Updated  Vital Signs BP 106/61 (BP Location: Left Arm)   Pulse 83   Temp 98.3 F (36.8 C) (Oral)   Resp 18   Ht 5\' 6"  (1.676 m)   Wt 73 kg (161 lb)   LMP 05/30/2017   SpO2 98%   BMI 25.99 kg/m   Physical Exam  Constitutional: She is oriented to person, place, and time. She appears well-developed and well-nourished. No distress.  HENT:  Head: Normocephalic and  atraumatic.  Right Ear: External ear normal.  Left Ear: External ear normal.  Mouth/Throat: Oropharynx is clear and moist.  Eyes: Pupils are equal, round, and reactive to light. Conjunctivae and EOM are normal. Right eye exhibits no discharge. Left eye exhibits no discharge.  Neck: Normal range of motion. Neck supple. No JVD present. No tracheal deviation present.  Cardiovascular: Normal rate, regular rhythm, normal heart sounds and intact distal pulses.  Exam reveals no gallop and no friction rub.   No murmur heard. 2+ radial and DP/PT pulses bl, negative Homan's bl   Pulmonary/Chest: Effort normal and breath sounds normal. No respiratory distress. She has no wheezes. She has no rales.  Abdominal: Soft. Bowel sounds are normal. She exhibits no distension. There is no tenderness.  Musculoskeletal: She exhibits no edema or deformity.  No midline spine TTP, no paraspinal muscle tenderness, no deformity, crepitus, or step-off noted. 5/5 strength of BUE and BLE major muscle groups. No tenderness on palpation of the external days. Active range of motion of external nares.  Neurological: She is alert and oriented to person, place, and time. No cranial nerve deficit or sensory deficit.  Fluent speech, no facial droop, sensation intact to soft touch of extremities, normal gait, and patient able to heel walk and toe walk without difficulty. No pronator drift. Normal finger to nose. Cranial nerves III through XII tested and intact.   Skin: Skin is warm and dry. No erythema.  Psychiatric: She has a normal mood and affect. Her behavior is normal.  Nursing note and vitals reviewed.    ED Treatments / Results  Labs (all labs ordered are listed, but only abnormal results are displayed) Labs Reviewed  COMPREHENSIVE METABOLIC PANEL - Abnormal; Notable for the following:       Result Value   Glucose, Bld 104 (*)    ALT 12 (*)    Total Bilirubin 0.1 (*)    All other components within normal limits   ACETAMINOPHEN LEVEL - Abnormal; Notable for the following:    Acetaminophen (Tylenol), Serum <10 (*)    All other components within normal limits  CBC - Abnormal; Notable for the following:    Hemoglobin 11.1 (*)    HCT 34.2 (*)    All other components within normal limits  ETHANOL  SALICYLATE LEVEL  RAPID URINE DRUG SCREEN, HOSP PERFORMED  VITAMIN B12  FOLATE  IRON AND TIBC  FERRITIN  RETICULOCYTES  URINALYSIS, ROUTINE W REFLEX MICROSCOPIC  HIV ANTIBODY (ROUTINE TESTING)  POC URINE PREG, ED    EKG  EKG Interpretation None       Radiology Dg Chest 2 View  Result Date: 06/25/2017 CLINICAL DATA:  Current history of multiple sclerosis, presenting with shortness of breath. EXAM: CHEST  2 VIEW COMPARISON:  None. FINDINGS: AP erect and lateral images were obtained. Cardiomediastinal silhouette unremarkable. Lungs clear. Bronchovascular markings normal. Pulmonary vascularity normal. No visible pleural effusions. No pneumothorax. Visualized bony thorax intact. IMPRESSION: No acute cardiopulmonary disease. Electronically Signed   By: Kayren Eaves.D.  On: 06/25/2017 15:06   Mr Laqueta Jean ZO Contrast  Result Date: 06/24/2017 CLINICAL DATA:  Multiple sclerosis. New neurologic symptoms with migraine headaches, loss of cord nasion balance, and stuttering speech. Difficulty concentrating and comprehending. Lapses of judgement. Symptoms worse over the last 2 weeks. EXAM: MRI HEAD WITHOUT AND WITH CONTRAST TECHNIQUE: Multiplanar, multiecho pulse sequences of the brain and surrounding structures were obtained without and with intravenous contrast. CONTRAST:  15 mL MultiHance COMPARISON:  None. FINDINGS: Brain: Multiple periventricular T2 hyperintensities are associated with the corpus callosum, typical of a demyelinating process. A 7 mm focus is present within the right internal capsule. A 7 mm focus is present superior and lateral to the atrium of the left lateral ventricle. There is no  significant restricted diffusion associated with these lesions. A 6 mm lesion in the right frontal lobe on image 95 of series 13 enhances. There is subtle enhancement of the lesion along the right side of the fourth ventricle. A punctate focus is present in the right parietal lobe on image 105. A punctate focus of enhancement is evident in the subcortical left parietal lobe on image 111 of series 13 . A 4 mm enhancing lesion is noted in the subcortical right frontal lobe on image 118. Additional punctate subcortical focus of enhancement is present in the high right frontal lobe near the vertex on image 134. Vascular: Flow is present in the major intracranial arteries. Skull and upper cervical spine: The skullbase is within normal limits. The craniocervical junction is normal. The upper cervical spine is unremarkable. Sinuses/Orbits: The paranasal sinuses are clear. The mastoid air cells are clear. The globes and orbits are within normal limits. IMPRESSION: 1. Multiple T2 hyperintense lesions in a pattern compatible with multiple sclerosis. 2. At least 6 of these lesions enhance suggesting new lesions or active demyelination. No restricted diffusion is present. 3. Prominent nonenhancing lesions are present in the right internal capsule and posterior and lateral to the atrium of the left lateral ventricle. 4. No acute or subacute infarct. Electronically Signed   By: Marin Roberts M.D.   On: 06/24/2017 15:38    Procedures Procedures (including critical care time)  Medications Ordered in ED Medications  methylPREDNISolone sodium succinate (SOLU-MEDROL) 1,000 mg in sodium chloride 0.9 % 50 mL IVPB (0 mg Intravenous Stopped 06/25/17 0909)  butalbital-acetaminophen-caffeine (FIORICET, ESGIC) 50-325-40 MG per tablet 1 tablet (not administered)  drospirenone-ethinyl estradiol (YASMIN,ZARAH,SYEDA) 3-0.03 MG per tablet 1 tablet (1 tablet Oral Not Given 06/25/17 1059)  enoxaparin (LOVENOX) injection 40 mg (40 mg  Subcutaneous Given 06/25/17 1058)  sodium chloride flush (NS) 0.9 % injection 3 mL (3 mLs Intravenous Given 06/25/17 1200)  acetaminophen (TYLENOL) tablet 650 mg (not administered)    Or  acetaminophen (TYLENOL) suppository 650 mg (not administered)  traMADol (ULTRAM) tablet 100 mg (not administered)  ketorolac (TORADOL) 30 MG/ML injection 30 mg (not administered)  senna-docusate (Senokot-S) tablet 1 tablet (not administered)  sorbitol 70 % solution 30 mL (not administered)  magnesium citrate solution 1 Bottle (not administered)  ondansetron (ZOFRAN) tablet 4 mg (not administered)    Or  ondansetron (ZOFRAN) injection 4 mg (not administered)  0.9 %  sodium chloride infusion ( Intravenous New Bag/Given 06/25/17 1201)  LORazepam (ATIVAN) tablet 0.5 mg (not administered)     Initial Impression / Assessment and Plan / ED Course  I have reviewed the triage vital signs and the nursing notes.  Pertinent labs & imaging results that were available during my care of the patient  were reviewed by me and considered in my medical decision making (see chart for details).     Patient presents today with abnormal MRI and complaints of decreased concentration and which she believes to be seizure-like activity for 2 weeks. Afebrile, vital signs are stable. Labwork in the ED unremarkable. Chest x-ray shows no acute cardiopulmonary abnormality. MRI performed yesterday shows evidence suggestive of multiple sclerosis with at least 6 new lesions suggestive of active demyelination. Consulted hospitalist Dr. Julian Reil, who agrees to assume care of patient and bring her into the hospital for management of acute MS flare. Spoke with neurology, who recommends Solumedrol 1000mg  IV x5 days.   Final Clinical Impressions(s) / ED Diagnoses   Final diagnoses:  Brain lesion    New Prescriptions Current Discharge Medication List       Bennye Alm 06/25/17 1654    Palumbo, April, MD 06/25/17 2343

## 2017-06-25 NOTE — Progress Notes (Signed)
Report handoff to [place], [place].

## 2017-06-25 NOTE — Evaluation (Signed)
Physical Therapy Evaluation Patient Details Name: Kristin Johnson MRN: [id number] DOB: November 16, 1986 Today's Date: 06/25/2017   History of Present Illness  Pt is a 31 y/o female admitted for MS exacerbation. Admitted following abnormal MRI results. CT showed multiple hyperintense lesions at T2 and prominent nonenhancing lesion at R internal capsule and at posterior and lateral to atrium of L lateral ventricle.   Clinical Impression  Pt admitted for problem above with deficits below. PTA, pt was independent with ambulation. Upon eval, pt presenting with weakness and mild unsteadiness. LOB X 1 during ambulation, however, able to self correct.  Pt required min guard to supervision throughout mobility. Pt reports she will have assist intermittently throughout the day. No follow up recommended. Will continue to follow to ensure safety and independence with functional mobility.,     Follow Up Recommendations No PT follow up    Equipment Recommendations  None recommended by PT    Recommendations for Other Services       Precautions / Restrictions Precautions Precautions: None Restrictions Weight Bearing Restrictions: No      Mobility  Bed Mobility Overal bed mobility: Independent             General bed mobility comments: No external assist required.   Transfers Overall transfer level: Needs assistance Equipment used: None Transfers: Sit to/from Stand Sit to Stand: Min guard         General transfer comment: Slight LOB upon standing, requiring min guard for safety.   Ambulation/Gait Ambulation/Gait assistance: Min guard;Supervision Ambulation Distance (Feet): 150 Feet Assistive device: None Gait Pattern/deviations: Step-through pattern;Decreased stride length Gait velocity: decreased Gait velocity interpretation: Below normal speed for age/gender General Gait Details: One instance of LOB during gait, however, pt able to self correct. Mild unsteadiness throughout, with  guarded gait, however, no other LOB noted. Min guard to supervision for safety.   Stairs            Wheelchair Mobility    Modified Rankin (Stroke Patients Only)       Balance Overall balance assessment: Needs assistance Sitting-balance support: No upper extremity supported;Feet supported Sitting balance-Leahy Scale: Good     Standing balance support: No upper extremity supported;During functional activity Standing balance-Leahy Scale: Fair                               Pertinent Vitals/Pain Pain Assessment: No/denies pain    Home Living Family/patient expects to be discharged to:: Private residence Living Arrangements: Spouse/significant other Available Help at Discharge: Family;Available PRN/intermittently Type of Home: Apartment Home Access: Stairs to enter Entrance Stairs-Rails: Left Entrance Stairs-Number of Steps: flight  Home Layout: One level Home Equipment: None      Prior Function Level of Independence: Independent         Comments: plant coordinator     Hand Dominance   Dominant Hand: Right    Extremity/Trunk Assessment   Upper Extremity Assessment Upper Extremity Assessment: Overall WFL for tasks assessed    Lower Extremity Assessment Lower Extremity Assessment: Generalized weakness (Grossly 4/5 throughout )    Cervical / Trunk Assessment Cervical / Trunk Assessment: Normal  Communication   Communication: No difficulties  Cognition Arousal/Alertness: Awake/alert Behavior During Therapy: WFL for tasks assessed/performed Overall Cognitive Status: Within Functional Limits for tasks assessed  General Comments General comments (skin integrity, edema, etc.): Educated about getting up with nursing staff throughout the day to prevent stiffness and weakness.    Exercises     Assessment/Plan    PT Assessment Patient needs continued PT services  PT Problem List  Decreased strength;Decreased balance;Decreased knowledge of precautions;Decreased mobility       PT Treatment Interventions DME instruction;Gait training;Stair training;Functional mobility training;Therapeutic activities;Therapeutic exercise;Balance training;Patient/family education    PT Goals (Current goals can be found in the Care Plan section)  Acute Rehab PT Goals Patient Stated Goal: to go home  PT Goal Formulation: With patient Time For Goal Achievement: 07/02/17 Potential to Achieve Goals: Good    Frequency Min 3X/week   Barriers to discharge        Co-evaluation               AM-PAC PT "6 Clicks" Daily Activity  Outcome Measure Difficulty turning over in bed (including adjusting bedclothes, sheets and blankets)?: None Difficulty moving from lying on back to sitting on the side of the bed? : None Difficulty sitting down on and standing up from a chair with arms (e.g., wheelchair, bedside commode, etc,.)?: Total Help needed moving to and from a bed to chair (including a wheelchair)?: A Little Help needed walking in hospital room?: A Little Help needed climbing 3-5 steps with a railing? : A Little 6 Click Score: 18    End of Session Equipment Utilized During Treatment: Gait belt Activity Tolerance: Patient tolerated treatment well Patient left: in chair;with call bell/phone within reach Nurse Communication: Mobility status PT Visit Diagnosis: Unsteadiness on feet (R26.81);Muscle weakness (generalized) (M62.81)    Time: 1300-1320 PT Time Calculation (min) (ACUTE ONLY): 20 min   Charges:   PT Evaluation $PT Eval Low Complexity: 1 Procedure PT Treatments $Gait Training: 8-22 mins   PT G Codes:        [name], PT, DPT  Acute Rehabilitation Services  Pager: [phone]   Lehman Prom 06/25/2017, 1:52 PM

## 2017-06-25 NOTE — Progress Notes (Signed)
Neurology paged per order. Pt is currently in 5C12. Thanks   Sim Boast, RN

## 2017-06-25 NOTE — ED Notes (Signed)
Bed: WA12 Expected date:  Expected time:  Means of arrival:  Comments: Hall B 

## 2017-06-25 NOTE — Telephone Encounter (Signed)
Paperwork scanned and faxed to employer on 06/25/17

## 2017-06-26 ENCOUNTER — Other Ambulatory Visit (HOSPITAL_COMMUNITY): Payer: Self-pay | Admitting: *Deleted

## 2017-06-26 DIAGNOSIS — F419 Anxiety disorder, unspecified: Secondary | ICD-10-CM | POA: Diagnosis not present

## 2017-06-26 DIAGNOSIS — D649 Anemia, unspecified: Secondary | ICD-10-CM

## 2017-06-26 DIAGNOSIS — G35 Multiple sclerosis: Secondary | ICD-10-CM | POA: Diagnosis present

## 2017-06-26 LAB — COMPREHENSIVE METABOLIC PANEL
ALBUMIN: 2.4 g/dL — AB (ref 3.5–5.0)
ALK PHOS: 36 U/L — AB (ref 38–126)
ALT: 10 U/L — ABNORMAL LOW (ref 14–54)
AST: 14 U/L — AB (ref 15–41)
Anion gap: 6 (ref 5–15)
BUN: 8 mg/dL (ref 6–20)
CALCIUM: 7.6 mg/dL — AB (ref 8.9–10.3)
CO2: 18 mmol/L — AB (ref 22–32)
Chloride: 116 mmol/L — ABNORMAL HIGH (ref 101–111)
Creatinine, Ser: 0.69 mg/dL (ref 0.44–1.00)
GFR calc Af Amer: >60 mL/min (ref >60)
GFR calc non Af Amer: >60 mL/min (ref >60)
GLUCOSE: 123 mg/dL — AB (ref 65–99)
POTASSIUM: 3.6 mmol/L (ref 3.5–5.1)
SODIUM: 140 mmol/L (ref 135–145)
TOTAL PROTEIN: 5 g/dL — AB (ref 6.5–8.1)
Total Bilirubin: 0.4 mg/dL (ref 0.3–1.2)

## 2017-06-26 LAB — HIV ANTIBODY (ROUTINE TESTING W REFLEX): HIV Screen 4th Generation wRfx: NONREACTIVE

## 2017-06-26 LAB — CBC
HEMATOCRIT: 32.1 % — AB (ref 36.0–46.0)
HEMOGLOBIN: 10.3 g/dL — AB (ref 12.0–15.0)
MCH: 26.4 pg (ref 26.0–34.0)
MCHC: 32.1 g/dL (ref 30.0–36.0)
MCV: 82.3 fL (ref 78.0–100.0)
Platelets: 258 10*3/uL (ref 150–400)
RBC: 3.9 MIL/uL (ref 3.87–5.11)
RDW: 13.3 % (ref 11.5–15.5)
WBC: 22.8 10*3/uL — ABNORMAL HIGH (ref 4.0–10.5)

## 2017-06-26 MED ORDER — SODIUM CHLORIDE 0.9 % IV SOLN: 1000.0000 mg | Freq: Every day | INTRAVENOUS | 0 refills | Status: DC

## 2017-06-26 NOTE — Discharge Summary (Signed)
Physician Discharge Summary  Kristin Johnson JYN:[id number] DOB: 1986-01-23 DOA: 06/25/2017  PCP: Morrell Riddle, PA-C  Admit date: 06/25/2017 Discharge date: 06/26/2017  Admitted From: home  Disposition:  home   Recommendations for Outpatient Follow-up:  1. outpt follow up   Discharge Condition:  stable   CODE STATUS:  Full code   Diet recommendation:  stable Consultations:  neurohospitalists     Discharge Diagnoses:  Principal Problem:   Multiple sclerosis exacerbation (HCC) Active Problems:   Anxiety   Anemia   Headache    Subjective: Symptoms of dizziness and balance disturbance improving. No new symptoms.   Brief Summary: Kristin Johnson is a 31 y.o. female with medical history significant of multiple sclerosis diagnosed in July 2011 presented to the ED with a 2 week history of worsening dizziness, seizure-like activity with body stiffening lasting a few seconds, decreased concentration, imbalance, headaches. She underwent and MRI and this suggested multiple acute demyelinating lesions. She was told by her PCP to come to the ER.   Hospital Course:  MS exacerbation - Neurology has recommended 5 days of IV steroids- she has received 2 days and has been set up as outpt to receive the other three - her imbalance has resolved and she is able to ambulate with out assistance no PT needs - she will f/u with outpt MS neuro, Dr Epimenio Foot  Anemia of chronic disease - anemia panel unrevealing other than the low normal B12- MCV not elevated   Discharge Instructions   Allergies as of 06/26/2017   No Known Allergies     Medication List    TAKE these medications   methylPREDNISolone sodium succinate 1,000 mg in sodium chloride 0.9 % 50 mL Inject 1,000 mg into the vein daily.   SYEDA 3-0.03 MG tablet Generic drug:  drospirenone-ethinyl estradiol      Follow-up Information    [facility] STAY Follow up on 06/27/2017.   Why:  Your appointment time is  10:00 am You also have an appointment on Friday 06/28/2017 at 10:00 am. short stay will inform you of the time for Saturday.  Contact information: [address] [id number] mc [place] [address] [phone]       Asa Lente, MD Follow up.   Specialty:  Neurology Contact information: [address] [place] [address] [phone]          No Known Allergies   Procedures/Studies:    Dg Chest 2 View  Result Date: 06/25/2017 CLINICAL DATA:  Current history of multiple sclerosis, presenting with shortness of breath. EXAM: CHEST  2 VIEW COMPARISON:  None. FINDINGS: AP erect and lateral images were obtained. Cardiomediastinal silhouette unremarkable. Lungs clear. Bronchovascular markings normal. Pulmonary vascularity normal. No visible pleural effusions. No pneumothorax. Visualized bony thorax intact. IMPRESSION: No acute cardiopulmonary disease. Electronically Signed   By: Hulan Saas M.D.   On: 06/25/2017 15:06   Mr Laqueta Jean LO Contrast  Result Date: 06/24/2017 CLINICAL DATA:  Multiple sclerosis. New neurologic symptoms with migraine headaches, loss of cord nasion balance, and stuttering speech. Difficulty concentrating and comprehending. Lapses of judgement. Symptoms worse over the last 2 weeks. EXAM: MRI HEAD WITHOUT AND WITH CONTRAST TECHNIQUE: Multiplanar, multiecho pulse sequences of the brain and surrounding structures were obtained without and with intravenous contrast. CONTRAST:  15 mL MultiHance COMPARISON:  None. FINDINGS: Brain: Multiple periventricular T2 hyperintensities are associated with the corpus callosum, typical of a demyelinating process. A 7 mm focus is present within the right  internal capsule. A 7 mm focus is present superior and lateral to the atrium of the left lateral ventricle. There is no significant restricted diffusion associated with these lesions. A 6 mm lesion in the right frontal lobe on image 95 of series 13 enhances.  There is subtle enhancement of the lesion along the right side of the fourth ventricle. A punctate focus is present in the right parietal lobe on image 105. A punctate focus of enhancement is evident in the subcortical left parietal lobe on image 111 of series 13 . A 4 mm enhancing lesion is noted in the subcortical right frontal lobe on image 118. Additional punctate subcortical focus of enhancement is present in the high right frontal lobe near the vertex on image 134. Vascular: Flow is present in the major intracranial arteries. Skull and upper cervical spine: The skullbase is within normal limits. The craniocervical junction is normal. The upper cervical spine is unremarkable. Sinuses/Orbits: The paranasal sinuses are clear. The mastoid air cells are clear. The globes and orbits are within normal limits. IMPRESSION: 1. Multiple T2 hyperintense lesions in a pattern compatible with multiple sclerosis. 2. At least 6 of these lesions enhance suggesting new lesions or active demyelination. No restricted diffusion is present. 3. Prominent nonenhancing lesions are present in the right internal capsule and posterior and lateral to the atrium of the left lateral ventricle. 4. No acute or subacute infarct. Electronically Signed   By: Marin Roberts M.D.   On: 06/24/2017 15:38       Discharge Exam: Vitals:   06/26/17 0558 06/26/17 0940  BP: 107/72 112/62  Pulse: 88 75  Resp: 16 20  Temp: 98.6 F (37 C) 98 F (36.7 C)   Vitals:   06/26/17 0104 06/26/17 0130 06/26/17 0558 06/26/17 0940  BP:  (!) 98/57 107/72 112/62  Pulse:   88 75  Resp:  18 16 20   Temp:   98.6 F (37 C) 98 F (36.7 C)  TempSrc:   Oral Oral  SpO2:  99% 98% 100%  Weight: 75 kg (165 lb 5.5 oz)     Height:        General: Pt is alert, awake, not in acute distress Cardiovascular: RRR, S1/S2 +, no rubs, no gallops Respiratory: CTA bilaterally, no wheezing, no rhonchi Abdominal: Soft, NT, ND, bowel sounds + Extremities: no  edema, no cyanosis    The results of significant diagnostics from this hospitalization (including imaging, microbiology, ancillary and laboratory) are listed below for reference.     Microbiology: No results found for this or any previous visit (from the past 240 hour(s)).   Labs: BNP (last 3 results) No results for input(s): BNP in the last 8760 hours. Basic Metabolic Panel:  Recent Labs Lab 06/24/17 0940 06/25/17 0225 06/26/17 0643  NA 139 137 140  K 4.6 3.9 3.6  CL 102 104 116*  CO2 22 25 18*  GLUCOSE 97 104* 123*  BUN 13 16 8   CREATININE 0.95 0.87 0.69  CALCIUM 9.6 9.0 7.6*   Liver Function Tests:  Recent Labs Lab 06/24/17 0940 06/25/17 0225 06/26/17 0643  AST 11 15 14*  ALT 10 12* 10*  ALKPHOS 61 54 36*  BILITOT 0.3 0.1* 0.4  PROT 7.3 7.5 5.0*  ALBUMIN 4.3 3.7 2.4*   No results for input(s): LIPASE, AMYLASE in the last 168 hours. No results for input(s): AMMONIA in the last 168 hours. CBC:  Recent Labs Lab 06/24/17 0940 06/25/17 0225 06/26/17 0643  WBC 9.7 10.5  22.8*  NEUTROABS 6.7  --   --   HGB 11.9 11.1* 10.3*  HCT 37.5 34.2* 32.1*  MCV 84 81.6 82.3  PLT 308 303 258   Cardiac Enzymes: No results for input(s): CKTOTAL, CKMB, CKMBINDEX, TROPONINI in the last 168 hours. BNP: Invalid input(s): POCBNP CBG:  Recent Labs Lab 06/25/17 2121  GLUCAP 211*   D-Dimer No results for input(s): DDIMER in the last 72 hours. Hgb A1c No results for input(s): HGBA1C in the last 72 hours. Lipid Profile No results for input(s): CHOL, HDL, LDLCALC, TRIG, CHOLHDL, LDLDIRECT in the last 72 hours. Thyroid function studies No results for input(s): TSH, T4TOTAL, T3FREE, THYROIDAB in the last 72 hours.  Invalid input(s): FREET3 Anemia work up  Recent Labs  06/25/17 0221  VITAMINB12 324  FOLATE 19.7  FERRITIN 34  TIBC 433  IRON 78  RETICCTPCT 1.6   Urinalysis    Component Value Date/Time   COLORURINE YELLOW 06/25/2017 1416   APPEARANCEUR CLEAR  06/25/2017 1416   LABSPEC 1.012 06/25/2017 1416   PHURINE 7.0 06/25/2017 1416   GLUCOSEU NEGATIVE 06/25/2017 1416   HGBUR NEGATIVE 06/25/2017 1416   BILIRUBINUR NEGATIVE 06/25/2017 1416   BILIRUBINUR negative 07/07/2016 1151   BILIRUBINUR negative 03/30/2015 0843   KETONESUR NEGATIVE 06/25/2017 1416   PROTEINUR NEGATIVE 06/25/2017 1416   UROBILINOGEN 0.2 07/07/2016 1151   NITRITE NEGATIVE 06/25/2017 1416   LEUKOCYTESUR NEGATIVE 06/25/2017 1416   Sepsis Labs Invalid input(s): PROCALCITONIN,  WBC,  LACTICIDVEN Microbiology No results found for this or any previous visit (from the past 240 hour(s)).   Time coordinating discharge: Over 30 minutes  SIGNED:   Calvert Cantor, MD  [facility] 06/26/2017, 1:50 PM Pager   If 7PM-7AM, please contact night-coverage [email or web address] Password [email or web address]

## 2017-06-26 NOTE — Progress Notes (Signed)
Patient discharged home with significant other. IV and telemetry discontinued. Discharge information given. Patient questions asked and answered. Patient will continue to obtain [facility]. Home medication of contraceptive pills returned to patient and signed pharmacy paperwork. Patient politely declines assistance to transportation and ambulated from unit with significant other. Lawson Radar

## 2017-06-26 NOTE — Progress Notes (Signed)
   06/25/17 1302  PT G-Codes **NOT FOR INPATIENT CLASS**  Functional Assessment Tool Used AM-PAC 6 Clicks Basic Mobility;Clinical judgement  Functional Limitation Mobility: Walking and moving around  Mobility: Walking and Moving Around Current Status (K5537) CK  Mobility: Walking and Moving Around Goal Status (S8270) CI   Inserting G Codes  [name], PT, DPT  Acute Rehabilitation Services  Pager: [phone]

## 2017-06-26 NOTE — Progress Notes (Signed)
Physical Therapy Treatment Patient Details Name: Kristin Johnson MRN: [id number] DOB: 1986/01/22 Today's Date: 06/26/2017    History of Present Illness Pt is a 31 y/o female admitted for MS exacerbation. Admitted following abnormal MRI results. CT showed multiple hyperintense lesions at T2 and prominent nonenhancing lesion at R internal capsule and at posterior and lateral to atrium of L lateral ventricle.     PT Comments    Pt mod I with ambulation and stairs today. Did not have any LOB while ambulating, does continue to have intermittent, brief periods of dizziness. Pt stops briefly, feeling passes, and she keeps going. No further acute PT needs at this time. PT signing off.     Follow Up Recommendations  No PT follow up     Equipment Recommendations  None recommended by PT    Recommendations for Other Services       Precautions / Restrictions Precautions Precautions: None Restrictions Weight Bearing Restrictions: No    Mobility  Bed Mobility Overal bed mobility: Independent                Transfers Overall transfer level: Modified independent Equipment used: None Transfers: Sit to/from Stand Sit to Stand: Modified independent (Device/Increase time)         General transfer comment: no LOB with transfers today  Ambulation/Gait Ambulation/Gait assistance: Supervision Ambulation Distance (Feet): 400 Feet Assistive device: None Gait Pattern/deviations: WFL(Within Functional Limits) Gait velocity: WFL Gait velocity interpretation: at or above normal speed for age/gender General Gait Details: pt did not lose balance but after she had been up >100' she had 2 bouts where she reached for wall and reported that she felt mild dizziness. She recovered quickly and was able to keep going.    Stairs Stairs: Yes   Stair Management: No rails;Alternating pattern;Forwards Number of Stairs: 20 General stair comments: pt performed 2 flights of stairs without difficulty.  Held hand over rail but did not need to use  Wheelchair Mobility    Modified Rankin (Stroke Patients Only)       Balance Overall balance assessment: Needs assistance Sitting-balance support: No upper extremity supported;Feet supported Sitting balance-Leahy Scale: Good     Standing balance support: No upper extremity supported;During functional activity Standing balance-Leahy Scale: Good Standing balance comment: pt able to compensate and self correct when she experiences deficits                            Cognition Arousal/Alertness: Awake/alert Behavior During Therapy: WFL for tasks assessed/performed Overall Cognitive Status: Within Functional Limits for tasks assessed                                        Exercises      General Comments General comments (skin integrity, edema, etc.): discussed home exercise program, yoga HIT etc.       Pertinent Vitals/Pain Pain Assessment: No/denies pain    Home Living                      Prior Function            PT Goals (current goals can now be found in the care plan section) Acute Rehab PT Goals Patient Stated Goal: to go home  PT Goal Formulation: All assessment and education complete, DC therapy Time For Goal Achievement: 07/02/17 Potential to Achieve Goals:  Good Progress towards PT goals: Goals met/education completed, patient discharged from PT    Frequency    Min 3X/week      PT Plan Current plan remains appropriate    Co-evaluation              AM-PAC PT "6 Clicks" Daily Activity  Outcome Measure  Difficulty turning over in bed (including adjusting bedclothes, sheets and blankets)?: None Difficulty moving from lying on back to sitting on the side of the bed? : None Difficulty sitting down on and standing up from a chair with arms (e.g., wheelchair, bedside commode, etc,.)?: None Help needed moving to and from a bed to chair (including a wheelchair)?:  None Help needed walking in hospital room?: None Help needed climbing 3-5 steps with a railing? : None 6 Click Score: 24    End of Session   Activity Tolerance: Patient tolerated treatment well Patient left: Other (comment) (with OT in hallway) Nurse Communication: Mobility status PT Visit Diagnosis: Unsteadiness on feet (R26.81);Muscle weakness (generalized) (M62.81)     Time: 6599-3570 PT Time Calculation (min) (ACUTE ONLY): 14 min  Charges:  $Gait Training: 8-22 mins                    G Codes:  Functional Assessment Tool Used: AM-PAC 6 Clicks Basic Mobility Functional Limitation: Mobility: Walking and moving around Mobility: Walking and Moving Around Goal Status [phone]): 0 percent impaired, limited or restricted Mobility: Walking and Moving Around Discharge Status [phone]): 0 percent impaired, limited or restricted    [place] 06/26/2017, 1:30 PM

## 2017-06-26 NOTE — Care Management Note (Signed)
Case Management Note  Patient Details  Name: Martell Racca MRN: [id number] Date of Birth: 13-Feb-1986  Subjective/Objective:                    Action/Plan: CM consulted to arrange for 3 days of IV Solumedrol through Short Stay. CM spoke with Laverne in Short Stay and received order form for the Solumedrol. CM faxed it back to [facility] and confirmed receipt. Pt to go at 10:00 am on Thursday and Friday to Short Stay and they will arrange for IV team to administer the medication on Sat.  Patient has transportation home today and for the IV treatments at short stay.   Expected Discharge Date:  06/26/17               Expected Discharge Plan:  Home/Self Care  In-House Referral:     Discharge planning Services  CM Consult, Other - See comment (IV Solumedrol through short stay)  Post Acute Care Choice:    Choice offered to:     DME Arranged:    DME Agency:     HH Arranged:    HH Agency:     Status of Service:  Completed, signed off  If discussed at [employer] of Stay Meetings, dates discussed:    Additional Comments:  Kermit Balo, RN 06/26/2017, 11:23 AM

## 2017-06-26 NOTE — Evaluation (Signed)
Occupational Therapy Evaluation and Discharge for OPOT  Patient Details Name: Kristin Johnson MRN: [id number] DOB: [birth year] Today's Date: 06/26/2017    History of Present Illness Pt is a 31 y/o female admitted for MS exacerbation. Admitted following abnormal MRI results. CT showed multiple hyperintense lesions at T2 and prominent nonenhancing lesion at R internal capsule and at posterior and lateral to atrium of L lateral ventricle.    Clinical Impression   PTA Pt independent in ADL and mobility. Pt works full time and additional part time job. Pt is currently at mod I level for ADL and mobility. Pt brought up concerns that she has trouble with short term memory and problem solving. None of these were evident during session, but Pt shared that it is her biggest concern going home. Pt would benefit from skilled OPOT to work on compensatory and cognitive strategies as Pt would like to return to work full time. (Or potentially an SLP evaluation). OT also discussed safety during bathing and suggested a non-slip mat. Pt not open to a shower chair at this time because she does not feel that she needs it since she is self-correcting LOB. Acute OT to sign off at this time. Thank you for this referral.     Follow Up Recommendations  Outpatient OT (for cognitive compensatory strategies)    Equipment Recommendations  None recommended by OT    Recommendations for Other Services       Precautions / Restrictions Precautions Precautions: None Restrictions Weight Bearing Restrictions: No      Mobility Bed Mobility Overal bed mobility: Independent                Transfers Overall transfer level: Modified independent Equipment used: None Transfers: Sit to/from Stand Sit to Stand: Modified independent (Device/Increase time)         General transfer comment: no LOB with transfers today    Balance Overall balance assessment: Needs assistance Sitting-balance support: No upper extremity  supported;Feet supported Sitting balance-Leahy Scale: Normal Sitting balance - Comments: sitting EOB for LB ADL   Standing balance support: No upper extremity supported;During functional activity Standing balance-Leahy Scale: Good Standing balance comment: pt able to compensate and self correct when she experiences deficits                           ADL either performed or assessed with clinical judgement   ADL Overall ADL's : Modified independent                                       General ADL Comments: Pt is able to don/doff sock, perform sink level grooming, has been independent in room with RN     Vision Baseline Vision/History: Wears glasses Wears Glasses: At all times Patient Visual Report: No change from baseline       Perception     Praxis      Pertinent Vitals/Pain Pain Assessment: No/denies pain     Hand Dominance Right   Extremity/Trunk Assessment Upper Extremity Assessment Upper Extremity Assessment: Overall WFL for tasks assessed   Lower Extremity Assessment Lower Extremity Assessment: Defer to PT evaluation   Cervical / Trunk Assessment Cervical / Trunk Assessment: Normal   Communication Communication Communication: No difficulties   Cognition Arousal/Alertness: Awake/alert Behavior During Therapy: WFL for tasks assessed/performed Overall Cognitive Status: Within Functional Limits for tasks assessed  General Comments  discussed home exercise program, yoga HIT etc. Pt also shared that she feels like she is having cognitive issues (memory, problem solving etc) and that is NEW in the past couple weeks    Exercises     Shoulder Instructions      Home Living Family/patient expects to be discharged to:: Private residence Living Arrangements: Spouse/significant other Available Help at Discharge: Family;Available PRN/intermittently Type of Home: Apartment Home Access:  Stairs to enter Entrance Stairs-Number of Steps: flight  Entrance Stairs-Rails: Left Home Layout: One level     Bathroom Shower/Tub: Chief Strategy Officer: Standard     Home Equipment: None          Prior Functioning/Environment Level of Independence: Independent        Comments: Production manager        OT Problem List: Decreased cognition      OT Treatment/Interventions:      OT Goals(Current goals can be found in the care plan section) Acute Rehab OT Goals Patient Stated Goal: to go home  OT Goal Formulation: With patient Time For Goal Achievement: 07/10/17 Potential to Achieve Goals: Good  OT Frequency:     Barriers to D/C:            Co-evaluation              AM-PAC PT "6 Clicks" Daily Activity     Outcome Measure Help from another person eating meals?: None Help from another person taking care of personal grooming?: None Help from another person toileting, which includes using toliet, bedpan, or urinal?: None Help from another person bathing (including washing, rinsing, drying)?: None Help from another person to put on and taking off regular upper body clothing?: None Help from another person to put on and taking off regular lower body clothing?: None 6 Click Score: 24   End of Session Nurse Communication: Mobility status  Activity Tolerance: Patient tolerated treatment well Patient left: in bed;with call bell/phone within reach;Other (comment) (independent in room)  OT Visit Diagnosis: Other symptoms and signs involving cognitive function                Time: 1308-6578 OT Time Calculation (min): 21 min Charges:  OT General Charges $OT Visit: 1 Procedure OT Evaluation $OT Eval Low Complexity: 1 Procedure G-Codes: OT G-codes **NOT FOR INPATIENT CLASS** Functional Assessment Tool Used: AM-PAC 6 Clicks Daily Activity Functional Limitation: Self care Self Care Current Status (I6962): 0 percent impaired, limited or  restricted Self Care Goal Status (X5284): 0 percent impaired, limited or restricted Self Care Discharge Status (X3244): 0 percent impaired, limited or restricted   Sherryl Manges OTR/L [phone]  Evern Bio Morganna Styles 06/26/2017, 2:13 PM

## 2017-06-27 ENCOUNTER — Ambulatory Visit (HOSPITAL_COMMUNITY)
Admit: 2017-06-27 | Discharge: 2017-06-27 | Disposition: A | Discharge status: Home / Self Care | Payer: BLUE CROSS/BLUE SHIELD | Attending: Internal Medicine | Admitting: Internal Medicine

## 2017-06-27 DIAGNOSIS — G35 Multiple sclerosis: Secondary | ICD-10-CM | POA: Diagnosis present

## 2017-06-27 MED ORDER — SODIUM CHLORIDE 0.9 % IV SOLN
1000.0000 mg | Freq: Once | INTRAVENOUS | Status: DC
  Administered 2017-06-27: 1000 mg via INTRAVENOUS
  Filled 2017-06-27: qty 8

## 2017-06-28 ENCOUNTER — Encounter (HOSPITAL_COMMUNITY)
Admission: RE | Admit: 2017-06-28 | Discharge: 2017-06-28 | Disposition: A | Discharge status: Home / Self Care | Payer: BLUE CROSS/BLUE SHIELD | Source: Ambulatory Visit | Attending: Internal Medicine | Admitting: Internal Medicine

## 2017-06-28 DIAGNOSIS — G35 Multiple sclerosis: Secondary | ICD-10-CM | POA: Diagnosis present

## 2017-06-28 MED ORDER — SODIUM CHLORIDE 0.9 % IV SOLN
1000.0000 mg | Freq: Once | INTRAVENOUS | Status: DC
  Administered 2017-06-28: 1000 mg via INTRAVENOUS
  Filled 2017-06-28: qty 8

## 2017-06-29 ENCOUNTER — Encounter (HOSPITAL_COMMUNITY)
Admit: 2017-06-29 | Discharge: 2017-06-29 | Disposition: A | Discharge status: Home / Self Care | Payer: BLUE CROSS/BLUE SHIELD | Attending: Internal Medicine | Admitting: Internal Medicine

## 2017-06-29 DIAGNOSIS — G35 Multiple sclerosis: Secondary | ICD-10-CM | POA: Diagnosis not present

## 2017-06-29 MED ORDER — SODIUM CHLORIDE 0.9 % IV SOLN
1000.0000 mg | Freq: Once | INTRAVENOUS | Status: AC
  Administered 2017-06-29: 1000 mg via INTRAVENOUS
  Filled 2017-06-29: qty 8

## 2017-07-01 ENCOUNTER — Encounter: Payer: Self-pay | Admitting: Neurology

## 2017-07-01 ENCOUNTER — Encounter: Payer: Self-pay | Admitting: *Deleted

## 2017-07-01 ENCOUNTER — Ambulatory Visit (INDEPENDENT_AMBULATORY_CARE_PROVIDER_SITE_OTHER): Payer: BLUE CROSS/BLUE SHIELD | Admitting: Neurology

## 2017-07-01 ENCOUNTER — Other Ambulatory Visit: Payer: Self-pay | Admitting: *Deleted

## 2017-07-01 VITALS — BP 124/83 | HR 59 | Resp 16 | Ht 66.0 in | Wt 168.5 lb

## 2017-07-01 DIAGNOSIS — G35 Multiple sclerosis: Secondary | ICD-10-CM | POA: Diagnosis not present

## 2017-07-01 DIAGNOSIS — R39198 Other difficulties with micturition: Secondary | ICD-10-CM

## 2017-07-01 DIAGNOSIS — R404 Transient alteration of awareness: Secondary | ICD-10-CM

## 2017-07-01 DIAGNOSIS — R569 Unspecified convulsions: Secondary | ICD-10-CM

## 2017-07-01 DIAGNOSIS — R51 Headache: Secondary | ICD-10-CM

## 2017-07-01 DIAGNOSIS — R5383 Other fatigue: Secondary | ICD-10-CM | POA: Diagnosis not present

## 2017-07-01 DIAGNOSIS — E559 Vitamin D deficiency, unspecified: Secondary | ICD-10-CM

## 2017-07-01 DIAGNOSIS — Z79899 Other long term (current) drug therapy: Secondary | ICD-10-CM

## 2017-07-01 DIAGNOSIS — F419 Anxiety disorder, unspecified: Secondary | ICD-10-CM | POA: Diagnosis not present

## 2017-07-01 DIAGNOSIS — R519 Headache, unspecified: Secondary | ICD-10-CM

## 2017-07-01 MED ORDER — MELOXICAM 7.5 MG PO TABS: 7.5000 mg | ORAL_TABLET | Freq: Every day | ORAL | 4 refills | Status: DC

## 2017-07-01 MED ORDER — AMPHETAMINE-DEXTROAMPHET ER 15 MG PO CP24: 15.0000 mg | ORAL_CAPSULE | Freq: Every day | ORAL | 0 refills | Status: DC

## 2017-07-01 MED ORDER — SERTRALINE HCL 50 MG PO TABS: 50.0000 mg | ORAL_TABLET | Freq: Every day | ORAL | 3 refills | Status: DC

## 2017-07-01 NOTE — Progress Notes (Signed)
FMLA paperwork completed, up front for pt. to pick up, as there  are parts that she needs to complete.  She also requested med for decreased focus,/cognition.  Per RAS, ok for Adderall XR 15mg  po once daily as needed. Rx. up front [facility]/fim

## 2017-07-01 NOTE — Progress Notes (Addendum)
[facility]  PATIENT: Kristin Johnson DOB: Dec 25, 1985  REFERRING DOCTOR OR PCP:  Benny Lennert, PA-C SOURCE: patient  _________________________________   HISTORICAL  CHIEF COMPLAINT:  Chief Complaint  Patient presents with  . Multiple Sclerosis    HISTORY OF PRESENT ILLNESS:  I had the pleasure seeing you patient, [name], at the MS center at [facility] for a neurologic consultation regarding her multiple sclerosis.  In 2011, she had loss of consciousness followed by a seizure.    One of her tests was an MRI and it was worrisome for MS. The diagnosis was confirmed with CSF analysis (reportedly c/w MS).     She started on Copaxone and rarely took shots.    She had a relapse with left arm numbness last year that lasted 2-3 weeks.    Three weeks ago,  she had the onset of vertigo that is slowly improving.   The vertigo is occurring in any position and not changed much with movements.    She presented to her primary care doctor and then the emergency room where she was admitted. MRI of the brain shows several new enhancing lesions including one in the right middle cerebellar peduncle/cerebellum adjacent to the fourth ventricle. The others were in the hemispheres. The cerebellum focus could explain her symptoms. She received several days of IV Solu-Medrol and was recently discharged and a follow-up appointment was made with me.   She feels her gait is ok.   She veers mildly to the right as she walks but leans to the left.  She can climb stirs but feels dizzy going down them.   She denies weakness and numbness currently.  Last year, she had left arm numbness.  Vision is doing well.   She denies any episodes of diplopia or optic neuritis.   There is no eye pain    She has had some episodes of urge incontinence and notes frequency.    She denies hesitancy.    No recent UTI.  She notes anxiety, partially due to medical issues.   She notes mild  depression.   She feels cognition is mildly affected with reduced concentration/focus.   She notes reduced executive function and noteed some problems copying a figure.    She is very fatigued some days, usually worse in the afternoons.  This seemed worse with the recent heat and she had one episode of LOC when more tired.       She has had episodes of a pressure sensation in her head sometimes followed by reduced awareness but not LOC.   She is able to hear what is being said.     These can occur a few times a day and last 30 seconds or so.  There is no GTC activity, urination or tongue biting.   She feels back to herself quickly.    She only had one GTC seizure in 2011 and she was unresponsive for a short while after that one.   She also reports headaches that will be on one side of the head of the other with a pressure sensation. These come on a few times a week and will last for much of the day. She can get nausea, photophobia and phonophobia with the headache will usually does not get vomiting.    I personally reviewed the MRI of the brain dated 06/24/2017. It shows multiple T2/FLAIR hyperintense foci in the cerebellum, brainstem and hemispheres in a pattern and configuration consistent with MS. Several  of the lesions including one in the periventricular right cerebellum/MCP enhances after contrast administration.  The other enhancing lesions are small and in the hemispheres.   REVIEW OF SYSTEMS: Constitutional: No fevers, chills, sweats, or change in appetite.  She notes fatigue and sometimes insomnia. Eyes: No visual changes, double vision, eye pain Ear, nose and throat: No hearing loss, ear pain, nasal congestion, sore throat Cardiovascular: No chest pain, palpitations Respiratory: No shortness of breath at rest or with exertion.   No wheezes GastrointestinaI: No nausea, vomiting, diarrhea, abdominal pain, fecal incontinence Genitourinary: Notes frequency and rare incontinence.  No  nocturia. Musculoskeletal: No neck pain, back pain Integumentary: No rash, pruritus, skin lesions Neurological: as above Psychiatric: Mild depression at this time.  Mild anxiety Endocrine: No palpitations, diaphoresis, change in appetite, change in weigh or increased thirst Hematologic/Lymphatic: No anemia, purpura, petechiae. Allergic/Immunologic: No itchy/runny eyes, nasal congestion, recent allergic reactions, rashes  ALLERGIES: No Known Allergies  HOME MEDICATIONS:  Current Outpatient Prescriptions:  .  SYEDA 3-0.03 MG tablet, , Disp: , Rfl:   PAST MEDICAL HISTORY: Past Medical History:  Diagnosis Date  . Anxiety   . Headache   . Multiple sclerosis (HCC)   . Syncope and collapse   . Vision abnormalities     PAST SURGICAL HISTORY: No past surgical history on file.  FAMILY HISTORY: Family History  Problem Relation Age of Onset  . Lung cancer Mother   . Heart attack Father   . Heart failure Father   . Kidney Stones Father   . Heart attack Sister   . Heart disease Brother   . Bell's palsy Brother     SOCIAL HISTORY:  Social History   Social History  . Marital status: Single    Spouse name: N/A  . Number of children: N/A  . Years of education: N/A   Occupational History  . Not on file.   Social History Main Topics  . Smoking status: Never Smoker  . Smokeless tobacco: Never Used  . Alcohol use Yes     Comment: weekly  . Drug use: No  . Sexual activity: Yes    Partners: Male    Birth control/ protection: Pill   Other Topics Concern  . Not on file   Social History Narrative  . No narrative on file     PHYSICAL EXAM  Vitals:   07/01/17 0845  BP: 124/83  Pulse: (!) 59  Resp: 16  Weight: 168 lb 8 oz (76.4 kg)  Height: 5\' 6"  (1.676 m)    Body mass index is 27.2 kg/m.   General: The patient is well-developed and well-nourished and in no acute distress  Eyes:  Funduscopic exam shows normal optic discs and retinal vessels.  Neck: The  neck is supple, no carotid bruits are noted.  The neck is nontender.  Cardiovascular: The heart has a regular rate and rhythm with a normal S1 and S2. There were no murmurs, gallops or rubs. Lungs are clear to auscultation.  Skin: Extremities are without significant edema.  Musculoskeletal:  Back is nontender  Neurologic Exam  Mental status: The patient is alert and oriented x 3 at the time of the examination. The patient has apparent normal recent and remote memory, with an apparently normal attention span and concentration ability.   Speech is normal.  Cranial nerves: Extraocular movements are full. Pupils are equal, round, and reactive to light and accomodation.  Visual fields are full.  Facial symmetry is present. There is good facial  sensation to soft touch bilaterally.Facial strength is normal.  Trapezius and sternocleidomastoid strength is normal. No dysarthria is noted.  The tongue is midline, and the patient has symmetric elevation of the soft palate. No obvious hearing deficits are noted.  Motor:  Muscle bulk is normal.   Tone is normal. Strength is  5 / 5 in all 4 extremities.   Sensory: Sensory testing shows reduced vibration in her left arm and slight temperature asymmetry, colder in the right hand.    Coordination: Cerebellar testing reveals good finger-nose-finger and heel-to-shin bilaterally.  Gait and station: Station is normal.   Gait is normal. Tandem gait is slightly wide. Romberg is negative.   Reflexes: Deep tendon reflexes are increased in her knees with spread but no ankle clonus.   The plantar responses are extensor.    DIAGNOSTIC DATA (LABS, IMAGING, TESTING) - I reviewed patient records, labs, notes, testing and imaging myself where available.  Lab Results  Component Value Date   WBC 22.8 (H) 06/26/2017   HGB 10.3 (L) 06/26/2017   HCT 32.1 (L) 06/26/2017   MCV 82.3 06/26/2017   PLT 258 06/26/2017      Component Value Date/Time   NA 140 06/26/2017 0643    NA 139 06/24/2017 0940   K 3.6 06/26/2017 0643   CL 116 (H) 06/26/2017 0643   CO2 18 (L) 06/26/2017 0643   GLUCOSE 123 (H) 06/26/2017 0643   BUN 8 06/26/2017 0643   BUN 13 06/24/2017 0940   CREATININE 0.69 06/26/2017 0643   CALCIUM 7.6 (L) 06/26/2017 0643   PROT 5.0 (L) 06/26/2017 0643   PROT 7.3 06/24/2017 0940   ALBUMIN 2.4 (L) 06/26/2017 0643   ALBUMIN 4.3 06/24/2017 0940   AST 14 (L) 06/26/2017 0643   ALT 10 (L) 06/26/2017 0643   ALKPHOS 36 (L) 06/26/2017 0643   BILITOT 0.4 06/26/2017 0643   BILITOT 0.3 06/24/2017 0940   GFRNONAA >60 06/26/2017 0643   GFRAA >60 06/26/2017 0643   No results found for: CHOL, HDL, LDLCALC, LDLDIRECT, TRIG, CHOLHDL No results found for: ASNK5L Lab Results  Component Value Date   VITAMINB12 324 06/25/2017   Lab Results  Component Value Date   TSH 0.966 10/03/2015       ASSESSMENT AND PLAN  Anxiety  Multiple sclerosis (HCC)  Nonintractable headache, unspecified chronicity pattern, unspecified headache type  Urinary dysfunction  Other fatigue  High risk medication use    In summary, Ms. Hulsebus is a 31 year old woman who was diagnosed with MS in 2011 who has clearcut evidence of activity with some aggressiveness.  Specifically, there are several lesions in the brainstem and cerebellum, one that enhanced consistent with an acute focus. There are several other enhancing focus is in the hemispheres confirming the aggressiveness of her MS. We went over some treatment options. She did not do well on Copaxone and because of needle fatigue is unable to take Betaseron, Avonex or Rebif. We discussed a couple of the oral options as well as IV options. Due to the aggressiveness of her MS and some compliance issues in the past, ocrelizumab would be her best option and she signed the service request form. We will check blood to make sure that she does not have evidence of chronic hepatitis B or latent tuberculosis.  If she has more episodes of  numbness or weakness, consider an MRI of the cervical spine to better evaluate for the possibility of MS plaque in the spinal cord.  A second issue is her  history of a seizure in 2011 and some more episodes of transient alteration of awareness, up to several times a day. We will check an EEG to determine if she has changes worrisome for epilepsy. If present, she would need to go on an anticonvulsant.      Additionally, she has some intermittent headaches.  If they do not improve with anti-inflammatories, consider starting Topamax or zonisamide as they may best help the headaches and her history of seizures.   Additionally, she has now depression and anxiety and Zoloft was started.  She will return to see me for a regular scheduled visit in 3-4 months but call sooner if she has any new or worsening neurologic symptoms.  She will return sooner for her infusion  Thank you for asking me to see Ms. Derusha for a neurologic consultation at the MS center at [facility]. Please let me know if I can be of further assistance with her or other patients in the future.    Akansha Wyche A. Epimenio Foot, MD, PhD, [place] 07/01/2017, 9:01 AM Certified in Neurology, Clinical Neurophysiology, Sleep Medicine, Pain Medicine and Neuroimaging Dir., MS Center at [facility]  [facility] [address] [place], [place] [address] [phone]

## 2017-07-02 LAB — VITAMIN D 25 HYDROXY (VIT D DEFICIENCY, FRACTURES): VIT D 25 HYDROXY: 16.7 ng/mL — AB (ref 30.0–100.0)

## 2017-07-02 LAB — HEPATITIS B SURFACE ANTIBODY,QUALITATIVE: HEP B SURFACE AB, QUAL: NONREACTIVE

## 2017-07-02 LAB — HEPATITIS B CORE ANTIBODY, TOTAL: Hep B Core Total Ab: NEGATIVE

## 2017-07-02 LAB — HEPATITIS B SURFACE ANTIGEN: Hepatitis B Surface Ag: NEGATIVE

## 2017-07-04 ENCOUNTER — Telehealth: Payer: Self-pay | Admitting: *Deleted

## 2017-07-04 MED ORDER — MELOXICAM 7.5 MG PO TABS: 7.5000 mg | ORAL_TABLET | Freq: Every day | ORAL | 4 refills | Status: DC

## 2017-07-04 MED ORDER — SERTRALINE HCL 50 MG PO TABS: 50.0000 mg | ORAL_TABLET | Freq: Every day | ORAL | 3 refills | Status: DC

## 2017-07-04 MED ORDER — VITAMIN D (ERGOCALCIFEROL) 1.25 MG (50000 UNIT) PO CAPS: 50000.0000 [IU] | ORAL_CAPSULE | ORAL | 0 refills | Status: DC

## 2017-07-04 NOTE — Telephone Encounter (Signed)
I have spoken with Kristin Johnson this afternoon and reviewed lab results as below.  She verbalized understanding of same, is agreeable to rx. Vit. D for 6 mos. Rx. escribed to [facility] per her request. Would also like Sertraline and Oxybutynin escribed to [facility], which I have done/fim

## 2017-07-04 NOTE — Telephone Encounter (Signed)
-----   Message from Asa Lente, MD sent at 07/03/2017 12:50 PM EDT ----- If not already done so, we can send in the ocrelizumab form. Hep B labs normal.  The TB test is still pending and we would need this result before she actually gets confused  Vitamin D is very low and I would like to do 50,000 units weekly 6 months and then have her go to daily OTC 5000 units

## 2017-07-05 LAB — QUANTIFERON TB GOLD ASSAY (BLOOD)

## 2017-07-05 LAB — QUANTIFERON IN TUBE
QFT TB AG MINUS NIL VALUE: <0 IU/mL
QUANTIFERON MITOGEN VALUE: 5.85 IU/mL
QUANTIFERON NIL VALUE: 0.03 [IU]/mL
QUANTIFERON TB AG VALUE: 0.02 [IU]/mL
QUANTIFERON TB GOLD: NEGATIVE

## 2017-07-05 NOTE — Telephone Encounter (Signed)
Pt. aware TB test is neg.  SRF has been turned in/fim

## 2017-07-05 NOTE — Telephone Encounter (Signed)
-----   Message from Asa Lente, MD sent at 07/05/2017  9:08 AM EDT ----- TB serology was fine. I think we already turned in the ocrelizumab form. If not please turn this in.

## 2017-07-08 ENCOUNTER — Telehealth: Payer: Self-pay | Admitting: Neurology

## 2017-07-08 NOTE — Telephone Encounter (Signed)
Patient states she will be losing her insurance on 07-17-17 and wants to know what options is there for treatment.

## 2017-07-10 ENCOUNTER — Other Ambulatory Visit: Payer: BLUE CROSS/BLUE SHIELD

## 2017-07-10 NOTE — Telephone Encounter (Signed)
We will try to get her onto the [employer] drug study

## 2017-07-15 ENCOUNTER — Telehealth: Payer: Self-pay | Admitting: Neurology

## 2017-07-15 NOTE — Telephone Encounter (Signed)
Pt calling re: paperwork that she had filled out that was not properly done.  Pt said this is paperwork that she needs completed by tomorrow and is asking to be called back

## 2017-07-15 NOTE — Telephone Encounter (Signed)
I have spoken with Kristin Johnson.  Part C, continuous leave section of FMLA paperwork amended and faxed back to [place]/fim

## 2017-07-17 ENCOUNTER — Other Ambulatory Visit: Payer: BLUE CROSS/BLUE SHIELD

## 2017-11-28 ENCOUNTER — Telehealth: Payer: Self-pay | Admitting: Neurology

## 2017-11-28 DIAGNOSIS — G35 Multiple sclerosis: Secondary | ICD-10-CM

## 2017-11-28 NOTE — Telephone Encounter (Signed)
Pt is wanting to know if she has missed the sign up time for [employer]es 8700 drug study.  Pt has been made aware that Dr Epimenio Foot and RN will not be back in office until Wed of next week, she has asked that this message be sent to available RN to see if she is able to consult with another doctor on the sign up time for Alkermes 8700 drug study.  Pt said she will also try and look into what she may be able to find out about this.  Pt is asking for a call back

## 2017-12-26 NOTE — Addendum Note (Signed)
Addended by: Candis Schatz I on: 12/26/2017 02:08 PM   Modules accepted: Orders

## 2017-12-26 NOTE — Telephone Encounter (Signed)
Referral (urgent) to [facility] placed in Epic/fim

## 2017-12-26 NOTE — Telephone Encounter (Signed)
Pt is requesting a phone call to discuss the drug study. Pt said its been almost a month and hasn't heard anything back. Please contact

## 2017-12-26 NOTE — Telephone Encounter (Signed)
Pt. last seen in July and at that time wanted to start Ocrevus.  I spoke with Mindy in the infusion suite--they tried mult. times to contact pt. to set her up with Ocrevus infusions, but pt. was nonresponsive, did not comply with supplying ins. info, etc.  Pt. sts. during that time she was changing jobs, had a loss of ins., didn't think she would be able to start Ocrevus. Sts. now she would like to discuss the study.  I have offered appt. with RAS on 12/30/17 at 9am, but she sts. she can't pay her $100 copay.  I have asked if she can just pay $5 and set up a payment plan.  She sts. she is ok paying $5 of her copay, but does not want to set up a payment plan.  Then stated she isn't sure if she can come in b/c she has moved to [place], so it would be a . commute for her.  She asked if there are any neurologists in [place] she can see and I have advised she call them asap, as she needs to start a dmt to prevent dz. progression.  She verbalized understanding of same/fim

## 2018-01-27 ENCOUNTER — Other Ambulatory Visit: Payer: Self-pay | Admitting: Neurology

## 2018-01-27 DIAGNOSIS — G35 Multiple sclerosis: Secondary | ICD-10-CM

## 2018-02-05 ENCOUNTER — Other Ambulatory Visit: Payer: BLUE CROSS/BLUE SHIELD

## 2018-02-08 ENCOUNTER — Other Ambulatory Visit: Payer: Self-pay

## 2018-02-10 ENCOUNTER — Ambulatory Visit
Admission: RE | Admit: 2018-02-10 | Discharge: 2018-02-10 | Disposition: A | Discharge status: Home / Self Care | Payer: No Typology Code available for payment source | Source: Ambulatory Visit | Attending: Neurology | Admitting: Neurology

## 2018-02-10 ENCOUNTER — Telehealth: Payer: Self-pay | Admitting: Neurology

## 2018-02-10 DIAGNOSIS — G35 Multiple sclerosis: Secondary | ICD-10-CM

## 2018-02-10 NOTE — Telephone Encounter (Signed)
I spoke to Kristin Johnson about the MRI. It shows several new MS plaques compared to her previous MRI including two that enhance after contrast. She should be able to come in next week to be randomized into the clinical study.   If for any reason she is unable to participate in the study, she will need to get started on another DMT medication

## 2018-02-11 ENCOUNTER — Other Ambulatory Visit: Payer: Self-pay

## 2018-02-19 ENCOUNTER — Telehealth: Payer: Self-pay | Admitting: Neurology

## 2018-02-19 NOTE — Telephone Encounter (Signed)
I spoke to [place] about her recent lab work.  The creatinine kinase was significantly elevated at about 1600 (less than 192 is normal) and transaminases and LDH were slightly elevated.  She does note that she did much more exercise than typical (walking 5 or 6 miles at a fast clip) the day before she came in for the blood work.     She does not report any cramps and does not note any change in her urine.  We will recheck at her next visit.  She is advised to call us if she does start noting a lot of muscle cramps or pain or notes that her urine turned much darker

## 2018-03-24 ENCOUNTER — Telehealth: Payer: Self-pay | Admitting: *Deleted

## 2018-03-24 ENCOUNTER — Ambulatory Visit: Payer: Self-pay | Admitting: Neurology

## 2018-03-24 NOTE — Telephone Encounter (Signed)
Pt contacted research department. Research reported that patient complains of UTI sx., and inquiring if MD can call in something for her. She is using [facility], [place] Phone: [phone].   I called pt. Sx have been going on for 3 weeks. She denies itching/pain. Has had UTI in the past. She is drinking cranberry juice. She was feeling a little better but noticed she then started peeing excessively. She is having frequency/urgency. Taking OTC Azo.  She does not have PCP in [place], which is where she is living. PCP in Epic (Benny Lennert, [place]) old PCP (I took this out). I recommended she establish herself with PCP in [place] to help address concerns such as this. Advised Dr. Epimenio Foot out of the office. We could not prescribe anything unless she was seen in the office. She states she only comes to [facility] for research appt.  She has OB appt this Wednesday but she is on her period currently and she may have to r/s appt since this was her PAP appt. I recommended she keep appt and call to see if they can address her concerns at that office visit. If not, I recommended she could go to urgent care/walk in clinic to be evaluated. She verbalized understanding and appreciation for call. She will call back if she has further questions/concerns.

## 2018-03-24 NOTE — Telephone Encounter (Signed)
Noted/fim 

## 2018-03-25 ENCOUNTER — Other Ambulatory Visit: Payer: Self-pay | Admitting: Neurology

## 2018-03-25 DIAGNOSIS — G35 Multiple sclerosis: Secondary | ICD-10-CM

## 2018-03-26 ENCOUNTER — Ambulatory Visit: Payer: Self-pay | Admitting: Neurology

## 2018-03-31 ENCOUNTER — Ambulatory Visit
Admission: RE | Admit: 2018-03-31 | Discharge: 2018-03-31 | Disposition: A | Discharge status: Home / Self Care | Payer: Self-pay | Source: Ambulatory Visit | Attending: Neurology | Admitting: Neurology

## 2018-03-31 DIAGNOSIS — G35 Multiple sclerosis: Secondary | ICD-10-CM

## 2018-03-31 MED ORDER — GADOBENATE DIMEGLUMINE 529 MG/ML IV SOLN
16.0000 mL | Freq: Once | INTRAVENOUS | Status: AC | PRN
  Administered 2018-03-31: 16 mL via INTRAVENOUS

## 2018-04-07 ENCOUNTER — Telehealth: Payer: Self-pay | Admitting: Neurology

## 2018-04-07 ENCOUNTER — Ambulatory Visit: Payer: Self-pay | Admitting: Neurology

## 2018-04-07 NOTE — Telephone Encounter (Signed)
Kristin Johnson comes in today for a research visit.  She had an MRI of the brain with and without contrast last week as part of the study.  It showed about 6 small enhancing lesions.  I discussed the results with her and we are concerned that the medication may not be controlling her MS as well as we would like.  She had done well in the past on Copaxone but did not like to give herself shots.  She thinks she would do better now.   She would like to go back to that..  Had her sign the service request forms.  If she has any trouble with the Copaxone, consider Aubagio, Mayzent, cladribine or 1 of the IV's.     She should return to see me in 3 to 4 months or sooner if there are new or worsening symptoms.

## 2018-04-08 ENCOUNTER — Encounter: Payer: Self-pay | Admitting: *Deleted

## 2018-04-21 ENCOUNTER — Ambulatory Visit: Payer: Self-pay | Admitting: Neurology

## 2018-04-22 ENCOUNTER — Other Ambulatory Visit: Payer: Self-pay | Admitting: Neurology

## 2018-04-22 MED ORDER — SULFAMETHOXAZOLE-TRIMETHOPRIM 800-160 MG PO TABS: 1.0000 | ORAL_TABLET | Freq: Two times a day (BID) | ORAL | 0 refills | Status: DC

## 2018-04-22 MED ORDER — ARMODAFINIL 200 MG PO TABS: ORAL_TABLET | ORAL | 5 refills | Status: DC

## 2018-04-22 NOTE — Progress Notes (Signed)
Patient having UTI symptoms.  Fatigue is worse and she would like to try a medication

## 2018-04-23 ENCOUNTER — Telehealth: Payer: Self-pay | Admitting: *Deleted

## 2018-04-23 ENCOUNTER — Ambulatory Visit: Payer: Self-pay | Admitting: Physician Assistant

## 2018-04-23 MED ORDER — GLATIRAMER ACETATE 40 MG/ML ~~LOC~~ SOSY: 40.0000 mg | PREFILLED_SYRINGE | SUBCUTANEOUS | 3 refills | Status: DC

## 2018-04-23 NOTE — Telephone Encounter (Signed)
Glatiramer escribedto [facility] in response to faxed request from them/fim

## 2018-04-24 ENCOUNTER — Telehealth: Payer: Self-pay

## 2018-04-24 NOTE — Telephone Encounter (Signed)
We received a prior authorization request for this medication. I have completed and submitted the PA on Cover My Meds and should have a determination within 48-72 hours.  Cover My Meds Key: [id number] - Rx #: [id number]

## 2018-04-29 NOTE — Telephone Encounter (Signed)
Fax received from [employer] [place], phone# [phone].   Glatiramer PA approved for dates of 04/24/18 thru 12/02/38.  Ref# [id number]

## 2018-05-14 ENCOUNTER — Ambulatory Visit: Payer: BLUE CROSS/BLUE SHIELD | Admitting: Physician Assistant

## 2018-05-14 ENCOUNTER — Encounter: Payer: Self-pay | Admitting: Physician Assistant

## 2018-05-14 ENCOUNTER — Other Ambulatory Visit: Payer: Self-pay

## 2018-05-14 VITALS — BP 108/72 | HR 90 | Temp 98.1°F | Resp 16 | Ht 66.0 in | Wt 174.0 lb

## 2018-05-14 DIAGNOSIS — B9689 Other specified bacterial agents as the cause of diseases classified elsewhere: Secondary | ICD-10-CM

## 2018-05-14 DIAGNOSIS — N898 Other specified noninflammatory disorders of vagina: Secondary | ICD-10-CM

## 2018-05-14 DIAGNOSIS — N760 Acute vaginitis: Secondary | ICD-10-CM | POA: Diagnosis not present

## 2018-05-14 DIAGNOSIS — N3000 Acute cystitis without hematuria: Secondary | ICD-10-CM | POA: Diagnosis not present

## 2018-05-14 DIAGNOSIS — R300 Dysuria: Secondary | ICD-10-CM | POA: Diagnosis not present

## 2018-05-14 LAB — POCT WET + KOH PREP
Trich by wet prep: ABSENT
Yeast by KOH: ABSENT
Yeast by wet prep: ABSENT

## 2018-05-14 LAB — POCT URINALYSIS DIP (MANUAL ENTRY)
Bilirubin, UA: NEGATIVE
Blood, UA: NEGATIVE
Glucose, UA: NEGATIVE mg/dL
Nitrite, UA: NEGATIVE
Protein Ur, POC: 30 mg/dL — AB
Spec Grav, UA: 1.02 (ref 1.010–1.025)
Urobilinogen, UA: 1 U/dL
pH, UA: 7 (ref 5.0–8.0)

## 2018-05-14 LAB — POC MICROSCOPIC URINALYSIS (UMFC): Mucus: ABSENT

## 2018-05-14 MED ORDER — METRONIDAZOLE 500 MG PO TABS: 500.0000 mg | ORAL_TABLET | Freq: Two times a day (BID) | ORAL | 0 refills | Status: DC

## 2018-05-14 MED ORDER — NITROFURANTOIN MONOHYD MACRO 100 MG PO CAPS: 100.0000 mg | ORAL_CAPSULE | Freq: Two times a day (BID) | ORAL | 0 refills | Status: DC

## 2018-05-14 NOTE — Patient Instructions (Addendum)
Start taking Flagyl for BV Start taking Macrobid for UTI.   Try using coconut oil, glycerin, metrogel. Boric acid suppositories.  Stay well hydrated.  Consider taking pro- or prebiotic.  Never douche. This can further disrupt the pH of your vagina.   Come back if you become symptomatic again.    Bacterial Vaginosis Bacterial vaginosis is a vaginal infection that occurs when the normal balance of bacteria in the vagina is disrupted. It results from an overgrowth of certain bacteria. This is the most common vaginal infection among women ages 52-44. Because bacterial vaginosis increases your risk for STIs (sexually transmitted infections), getting treated can help reduce your risk for chlamydia, gonorrhea, herpes, and HIV (human immunodeficiency virus). Treatment is also important for preventing complications in pregnant women, because this condition can cause an early (premature) delivery. What are the causes? This condition is caused by an increase in harmful bacteria that are normally present in small amounts in the vagina. However, the reason that the condition develops is not fully understood. What increases the risk? The following factors may make you more likely to develop this condition:  Having a new sexual partner or multiple sexual partners.  Having unprotected sex.  Douching.  Having an intrauterine device (IUD).  Smoking.  Drug and alcohol abuse.  Taking certain antibiotic medicines.  Being pregnant.  You cannot get bacterial vaginosis from toilet seats, bedding, swimming pools, or contact with objects around you. What are the signs or symptoms? Symptoms of this condition include:  Grey or white vaginal discharge. The discharge can also be watery or foamy.  A fish-like odor with discharge, especially after sexual intercourse or during menstruation.  Itching in and around the vagina.  Burning or pain with urination.  Some women with bacterial vaginosis have no  signs or symptoms. How is this diagnosed? This condition is diagnosed based on:  Your medical history.  A physical exam of the vagina.  Testing a sample of vaginal fluid under a microscope to look for a large amount of bad bacteria or abnormal cells. Your health care provider may use a cotton swab or a small wooden spatula to collect the sample.  How is this treated? This condition is treated with antibiotics. These may be given as a pill, a vaginal cream, or a medicine that is put into the vagina (suppository). If the condition comes back after treatment, a second round of antibiotics may be needed. Follow these instructions at home: Medicines  Take over-the-counter and prescription medicines only as told by your health care provider.  Take or use your antibiotic as told by your health care provider. Do not stop taking or using the antibiotic even if you start to feel better. General instructions  If you have a female sexual partner, tell her that you have a vaginal infection. She should see her health care provider and be treated if she has symptoms. If you have a female sexual partner, he does not need treatment.  During treatment: ? Avoid sexual activity until you finish treatment. ? Do not douche. ? Avoid alcohol as directed by your health care provider. ? Avoid breastfeeding as directed by your health care provider.  Drink enough water and fluids to keep your urine clear or pale yellow.  Keep the area around your vagina and rectum clean. ? Wash the area daily with warm water. ? Wipe yourself from front to back after using the toilet.  Keep all follow-up visits as told by your health care provider. This  is important. How is this prevented?  Do not douche.  Wash the outside of your vagina with warm water only.  Use protection when having sex. This includes latex condoms and dental dams.  Limit how many sexual partners you have. To help prevent bacterial vaginosis, it is  best to have sex with just one partner (monogamous).  Make sure you and your sexual partner are tested for STIs.  Wear cotton or cotton-lined underwear.  Avoid wearing tight pants and pantyhose, especially during summer.  Limit the amount of alcohol that you drink.  Do not use any products that contain nicotine or tobacco, such as cigarettes and e-cigarettes. If you need help quitting, ask your health care provider.  Do not use illegal drugs. Where to find more information:  Centers for Disease Control and Prevention: AppraiserFraud.fi  American Sexual Health Association (ASHA): www.ashastd.org  U.S. Department of Health and Financial controller, Office on Women's Health: DustingSprays.pl or SecuritiesCard.it Contact a health care provider if:  Your symptoms do not improve, even after treatment.  You have more discharge or pain when urinating.  You have a fever.  You have pain in your abdomen.  You have pain during sex.  You have vaginal bleeding between periods. Summary  Bacterial vaginosis is a vaginal infection that occurs when the normal balance of bacteria in the vagina is disrupted.  Because bacterial vaginosis increases your risk for STIs (sexually transmitted infections), getting treated can help reduce your risk for chlamydia, gonorrhea, herpes, and HIV (human immunodeficiency virus). Treatment is also important for preventing complications in pregnant women, because the condition can cause an early (premature) delivery.  This condition is treated with antibiotic medicines. These may be given as a pill, a vaginal cream, or a medicine that is put into the vagina (suppository). This information is not intended to replace advice given to you by your health care provider. Make sure you discuss any questions you have with your health care provider. Document Released: 11/19/2005 Document Revised: 03/25/2017 Document Reviewed:  08/04/2016 Elsevier Interactive Patient Education  [employer].  Thank you for coming in today. I hope you feel we met your needs.  Feel free to call PCP if you have any questions or further requests.  Please consider signing up for MyChart if you do not already have it, as this is a great way to communicate with me.  Best,  Whitney McVey, PA-C  IF you received an x-ray today, you will receive an invoice from [facility]. Please contact [facility] at [phone] with questions or concerns regarding your invoice.   IF you received labwork today, you will receive an invoice from [facility]. Please contact [facility] at [phone] with questions or concerns regarding your invoice.   Our billing staff will not be able to assist you with questions regarding bills from these companies.  You will be contacted with the lab results as soon as they are available. The fastest way to get your results is to activate your My Chart account. Instructions are located on the last page of this paperwork. If you have not heard from [place] regarding the results in 2 weeks, please contact this office.

## 2018-05-14 NOTE — Progress Notes (Signed)
Kristin Johnson  MRN: [id number] DOB: July 09, 1986  PCP: Sebastian Ache, PA-C  Subjective:  Pt is a 32 year old female who presents to clinic for vaginal irritation.  She has one sexual partner for the past 3 years. They have always used a condom. Stopped using condom 3 months ago. Since that time she has had problems with UTI's. Received treatment from neurologist and [facility], however never had urine culture done that she is aware of.  Endorses incomplete voiding x several days. Denies urinary frequency, hematuria, urgency, dysuria, fever, chills.  The past few days she endorses vaginal irritation and odor. She soaked a tampon in hydrogen peroxide twice and used this "I read online about how to kill BV". Last night she tried to have sex and it was incredibly painful.  She is concerned bc since she and her partner started having unprotected sex, she is experiencing these vaginal symptoms.   Review of Systems  Constitutional: Negative for chills, fatigue and fever.  Cardiovascular: Negative for chest pain and palpitations.  Gastrointestinal: Negative for abdominal pain, diarrhea, nausea and vomiting.  Genitourinary: Positive for decreased urine volume, dyspareunia and vaginal discharge (white, thin). Negative for difficulty urinating, dysuria, enuresis, flank pain, frequency, hematuria, urgency and vaginal pain.  Musculoskeletal: Negative for back pain.    Patient Active Problem List   Diagnosis Date Noted  . Multiple sclerosis (HCC) 07/01/2017  . Urinary dysfunction 07/01/2017  . Other fatigue 07/01/2017  . High risk medication use 07/01/2017  . Vitamin D deficiency 07/01/2017  . Seizure (HCC) 07/01/2017  . Transient alteration of awareness 07/01/2017  . Anxiety 06/25/2017  . Anemia 06/25/2017  . Headache 06/25/2017  . Post traumatic stress disorder (PTSD) 06/01/2015  . Multiple sclerosis exacerbation (HCC) 03/30/2015    Current Outpatient Medications on File  Prior to Visit  Medication Sig Dispense Refill  . Glatiramer Acetate (COPAXONE) 40 MG/ML SOSY Inject 40 mg into the skin 3 (three) times a week. 36 Syringe 3  . amphetamine-dextroamphetamine (ADDERALL XR) 15 MG 24 hr capsule Take 1 capsule by mouth daily. (Patient not taking: Reported on 05/14/2018) 30 capsule 0  . Armodafinil 200 MG TABS 1/2 to 1 pill po q AM (Patient not taking: Reported on 05/14/2018) 30 tablet 5  . meloxicam (MOBIC) 7.5 MG tablet Take 1 tablet (7.5 mg total) by mouth daily. (Patient not taking: Reported on 05/14/2018) 90 tablet 4  . sertraline (ZOLOFT) 50 MG tablet Take 1 tablet (50 mg total) by mouth daily. (Patient not taking: Reported on 05/14/2018) 90 tablet 3  . SYEDA 3-0.03 MG tablet     . Vitamin D, Ergocalciferol, (DRISDOL) 50000 units CAPS capsule Take 1 capsule (50,000 Units total) by mouth every 7 (seven) days. (Patient not taking: Reported on 05/14/2018) 24 capsule 0   No current facility-administered medications on file prior to visit.     No Known Allergies   Objective:  BP 108/72 (BP Location: Left Arm, Patient Position: Sitting, Cuff Size: Normal)   Pulse 90   Temp 98.1 F (36.7 C) (Oral)   Resp 16   Ht 5\' 6"  (1.676 m)   Wt 174 lb (78.9 kg)   LMP 03/16/2018 Comment: birth control  SpO2 100%   BMI 28.08 kg/m   Physical Exam  Constitutional: She is oriented to person, place, and time. No distress.  Genitourinary: Pelvic exam was performed with patient supine. There is no rash or tenderness on the right labia. There is no rash or tenderness on the  left labia. Cervix exhibits discharge (thin, white). Cervix exhibits no motion tenderness. Right adnexum displays no mass and no tenderness. Left adnexum displays no mass and no tenderness. There is tenderness in the vagina.  Neurological: She is alert and oriented to person, place, and time.  Skin: Skin is warm and dry.  Psychiatric: Judgment normal.  Vitals reviewed.  Results for orders placed or performed  in visit on 05/14/18  POCT urinalysis dipstick  Result Value Ref Range   Color, UA yellow yellow   Clarity, UA cloudy (A) clear   Glucose, UA negative negative mg/dL   Bilirubin, UA negative negative   Ketones, POC UA trace (5) (A) negative mg/dL   Spec Grav, UA 0.981 1.914 - 1.025   Blood, UA negative negative   pH, UA 7.0 5.0 - 8.0   Protein Ur, POC =30 (A) negative mg/dL   Urobilinogen, UA 1.0 0.2 or 1.0 E.U./dL   Nitrite, UA Negative Negative   Leukocytes, UA Moderate (2+) (A) Negative  POCT Microscopic Urinalysis ([facility])  Result Value Ref Range   WBC,UR,HPF,POC Too numerous to count  (A) None WBC/hpf   RBC,UR,HPF,POC None None RBC/hpf   Bacteria Few (A) None, Too numerous to count   Mucus Absent Absent   Epithelial Cells, UR Per Microscopy Moderate (A) None, Too numerous to count cells/hpf  POCT Wet + KOH Prep  Result Value Ref Range   Yeast by KOH Absent Absent   Yeast by wet prep Absent Absent   WBC by wet prep Too numerous to count  (A) Few   Clue Cells Wet Prep HPF POC Few (A) None   Trich by wet prep Absent Absent   Bacteria Wet Prep HPF POC Few Few   Epithelial Cells By [employer] Pref ([facility]) Moderate (A) None, Few, Too numerous to count   RBC,UR,HPF,POC None None RBC/hpf    Assessment and Plan :  1. BV (bacterial vaginosis) - Pt presents c/o incomplete voiding and vaginal irritation. Advised pt to never douche with hydrogen peroxide or any other substance. She may have to have her partner wear condoms to prevent further BV or UTI's. RTC if symptoms fail to improve   - metroNIDAZOLE (FLAGYL) 500 MG tablet; Take 1 tablet (500 mg total) by mouth 2 (two) times daily with a meal. DO NOT CONSUME ALCOHOL WHILE TAKING THIS MEDICATION.  Dispense: 14 tablet; Refill: 0  2. Acute cystitis without hematuria - nitrofurantoin, macrocrystal-monohydrate, (MACROBID) 100 MG capsule; Take 1 capsule (100 mg total) by mouth 2 (two) times daily.  Dispense: 20 capsule; Refill: 0 - Will await  culture.  3. Dysuria - POCT urinalysis dipstick - POCT Microscopic Urinalysis ([facility]) - Urine Culture  4. Vaginal discharge - POCT Wet + KOH Prep - + clue cells. Plan to treat with Flagyl.    Marco Collie, PA-C  Primary Care at [facility] 05/14/2018 2:37 PM

## 2018-05-15 LAB — URINE CULTURE

## 2018-05-19 ENCOUNTER — Ambulatory Visit: Payer: Self-pay | Admitting: Neurology

## 2018-05-26 ENCOUNTER — Ambulatory Visit: Payer: Self-pay | Admitting: Neurology

## 2018-06-11 ENCOUNTER — Telehealth: Payer: Self-pay | Admitting: Neurology

## 2018-06-11 MED ORDER — BACLOFEN 10 MG PO TABS: 10.0000 mg | ORAL_TABLET | Freq: Three times a day (TID) | ORAL | 1 refills | Status: DC

## 2018-06-11 MED ORDER — PREDNISONE 50 MG PO TABS
ORAL_TABLET | ORAL | 0 refills | Status: DC
Start: 2018-06-11 — End: 2018-07-30

## 2018-06-11 NOTE — Telephone Encounter (Signed)
She has noted mild left sided facial weakness and mild gait ataxia since last Wednesday.  She feels she is doing about the same now as she was a couple days ago.  Of note, she is transitioning from ALKS-8700, a study medication similar to Tecfidera, to Copaxone, a drug that she has been stable on in the past.     She appears to be having a small exacerbation and I will call in a high-dose oral taper of prednisone.  Additionally as she notes a little bit more spasticity especially at bedtime I will call in baclofen to take up to 10 mg 3 times daily.   We had a discussion that the last MRI did show significant activity with about a half dozen small enhancing lesions (was on study drug at that time) if she has one more exacerbation or if a repeat MRI later this year shows continued activity, then I would recommend that she go on a higher efficacy medication such as 1 of the IV medications, Mayzent or Mavenclad.  I would discuss this further with her when she returns in a couple months for a regular visit.

## 2018-07-30 ENCOUNTER — Ambulatory Visit (INDEPENDENT_AMBULATORY_CARE_PROVIDER_SITE_OTHER): Payer: BLUE CROSS/BLUE SHIELD | Admitting: Family Medicine

## 2018-07-30 ENCOUNTER — Other Ambulatory Visit: Payer: Self-pay

## 2018-07-30 ENCOUNTER — Encounter: Payer: Self-pay | Admitting: Family Medicine

## 2018-07-30 VITALS — BP 110/68 | HR 86 | Temp 99.6°F | Ht 66.0 in | Wt 173.6 lb

## 2018-07-30 DIAGNOSIS — R350 Frequency of micturition: Secondary | ICD-10-CM

## 2018-07-30 LAB — POCT URINALYSIS DIP (MANUAL ENTRY)
BILIRUBIN UA: NEGATIVE
BILIRUBIN UA: NEGATIVE mg/dL
Blood, UA: NEGATIVE
GLUCOSE UA: NEGATIVE mg/dL
LEUKOCYTES UA: NEGATIVE
Nitrite, UA: NEGATIVE
PROTEIN UA: NEGATIVE mg/dL
Spec Grav, UA: 1.015 (ref 1.010–1.025)
Urobilinogen, UA: 0.2 E.U./dL
pH, UA: 8.5 — AB (ref 5.0–8.0)

## 2018-07-30 NOTE — Patient Instructions (Addendum)
   If you have lab work done today you will be contacted with your lab results within the next 2 weeks.  If you have not heard from us then please contact us. The fastest way to get your results is to register for My Chart.   IF you received an x-ray today, you will receive an invoice from [facility]. Please contact [facility] at [phone] with questions or concerns regarding your invoice.   IF you received labwork today, you will receive an invoice from [facility]. Please contact [facility] at [phone] with questions or concerns regarding your invoice.   Our billing staff will not be able to assist you with questions regarding bills from these companies.  You will be contacted with the lab results as soon as they are available. The fastest way to get your results is to activate your My Chart account. Instructions are located on the last page of this paperwork. If you have not heard from us regarding the results in 2 weeks, please contact this office.      Urinary Tract Infection, Adult A urinary tract infection (UTI) is an infection of any part of the urinary tract, which includes the kidneys, ureters, bladder, and urethra. These organs make, store, and get rid of urine in the body. UTI can be a bladder infection (cystitis) or kidney infection (pyelonephritis). What are the causes? This infection may be caused by fungi, viruses, or bacteria. Bacteria are the most common cause of UTIs. This condition can also be caused by repeated incomplete emptying of the bladder during urination. What increases the risk? This condition is more likely to develop if:  You ignore your need to urinate or hold urine for long periods of time.  You do not empty your bladder completely during urination.  You wipe back to front after urinating or having a bowel movement, if you are female.  You are uncircumcised, if you are female.  You are constipated.  You have a urinary catheter  that stays in place (indwelling).  You have a weak defense (immune) system.  You have a medical condition that affects your bowels, kidneys, or bladder.  You have diabetes.  You take antibiotic medicines frequently or for long periods of time, and the antibiotics no longer work well against certain types of infections (antibiotic resistance).  You take medicines that irritate your urinary tract.  You are exposed to chemicals that irritate your urinary tract.  You are female.  What are the signs or symptoms? Symptoms of this condition include:  Fever.  Frequent urination or passing small amounts of urine frequently.  Needing to urinate urgently.  Pain or burning with urination.  Urine that smells bad or unusual.  Cloudy urine.  Pain in the lower abdomen or back.  Trouble urinating.  Blood in the urine.  Vomiting or being less hungry than normal.  Diarrhea or abdominal pain.  Vaginal discharge, if you are female.  How is this diagnosed? This condition is diagnosed with a medical history and physical exam. You will also need to provide a urine sample to test your urine. Other tests may be done, including:  Blood tests.  Sexually transmitted disease (STD) testing.  If you have had more than one UTI, a cystoscopy or imaging studies may be done to determine the cause of the infections. How is this treated? Treatment for this condition often includes a combination of two or more of the following:  Antibiotic medicine.  Other medicines to treat less   common causes of UTI.  Over-the-counter medicines to treat pain.  Drinking enough water to stay hydrated.  Follow these instructions at home:  Take over-the-counter and prescription medicines only as told by your health care provider.  If you were prescribed an antibiotic, take it as told by your health care provider. Do not stop taking the antibiotic even if you start to feel better.  Avoid alcohol, caffeine,  tea, and carbonated beverages. They can irritate your bladder.  Drink enough fluid to keep your urine clear or pale yellow.  Keep all follow-up visits as told by your health care provider. This is important.  Make sure to: ? Empty your bladder often and completely. Do not hold urine for long periods of time. ? Empty your bladder before and after sex. ? Wipe from front to back after a bowel movement if you are female. Use each tissue one time when you wipe. Contact a health care provider if:  You have back pain.  You have a fever.  You feel nauseous or vomit.  Your symptoms do not get better after 3 days.  Your symptoms go away and then return. Get help right away if:  You have severe back pain or lower abdominal pain.  You are vomiting and cannot keep down any medicines or water. This information is not intended to replace advice given to you by your health care provider. Make sure you discuss any questions you have with your health care provider. Document Released: 08/29/2005 Document Revised: 05/02/2016 Document Reviewed: 10/10/2015 Elsevier Interactive Patient Education  2018 Elsevier Inc.  

## 2018-07-30 NOTE — Progress Notes (Signed)
Chief Complaint  Patient presents with  . Urinary Frequency    HPI  Onset 2 weeks ago  Patient's last menstrual period was 07/16/2018 (lmp unknown). She reports dysuria, urinary frequency and pain with intercourse She reports that has been having some back pains but her MS medication can also cause MS But no usual back pain She has a history of MS, she gets some bladder spasms      Past Medical History:  Diagnosis Date  . Anxiety   . Headache   . Multiple sclerosis (HCC)   . Syncope and collapse   . Vision abnormalities     Current Outpatient Medications  Medication Sig Dispense Refill  . Glatiramer Acetate 40 MG/ML SOSY glatiramer 40 mg/mL subcutaneous syringe     No current facility-administered medications for this visit.     Allergies: No Known Allergies  No past surgical history on file.  Social History   Socioeconomic History  . Marital status: Single    Spouse name: Not on file  . Number of children: Not on file  . Years of education: Not on file  . Highest education level: Not on file  Occupational History  . Not on file  Social Needs  . Financial resource strain: Not on file  . Food insecurity:    Worry: Not on file    Inability: Not on file  . Transportation needs:    Medical: Not on file    Non-medical: Not on file  Tobacco Use  . Smoking status: Never Smoker  . Smokeless tobacco: Never Used  Substance and Sexual Activity  . Alcohol use: Yes    Comment: weekly  . Drug use: No  . Sexual activity: Yes    Partners: Male    Birth control/protection: Pill  Lifestyle  . Physical activity:    Days per week: Not on file    Minutes per session: Not on file  . Stress: Not on file  Relationships  . Social connections:    Talks on phone: Not on file    Gets together: Not on file    Attends religious service: Not on file    Active member of club or organization: Not on file    Attends meetings of clubs or organizations: Not on file   Relationship status: Not on file  Other Topics Concern  . Not on file  Social History Narrative  . Not on file    Family History  Problem Relation Age of Onset  . Lung cancer Mother   . Heart attack Father   . Heart failure Father   . Kidney Stones Father   . Heart attack Sister   . Heart disease Brother   . Bell's palsy Brother      ROS Review of Systems See HPI Constitution: No fevers or chills No malaise No diaphoresis Skin: No rash or itching Eyes: no blurry vision, no double vision GU: see hpi Neuro: no dizziness or headaches all others reviewed and negative   Objective: Vitals:   07/30/18 1644  BP: 110/68  Pulse: 86  Temp: 99.6 F (37.6 C)  TempSrc: Oral  SpO2: 100%  Weight: 173 lb 9.6 oz (78.7 kg)  Height: 5\' 6"  (1.676 m)    Physical Exam Physical Exam  Constitutional: She is oriented to person, place, and time. She appears well-developed and well-nourished.  HENT:  Head: Normocephalic and atraumatic.  Eyes: Conjunctivae and EOM are normal.  Cardiovascular: Normal rate, regular rhythm and normal heart sounds.  Pulmonary/Chest: Effort normal and breath sounds normal. No respiratory distress. She has no wheezes.  Abdominal: Normal appearance and bowel sounds are normal. There is no tenderness. There is no CVA tenderness.  Neurological: She is alert and oriented to person, place, and time.     Component     Latest Ref Rng & Units 07/30/2018 07/30/2018         4:52 PM  4:52 PM  Color, UA     yellow yellow   Clarity, UA     clear cloudy (A)   Glucose     negative mg/dL negative   Bilirubin, UA     negative negative negative  Specific Gravity, UA     1.010 - 1.025 1.015   RBC, UA     negative negative   pH, UA     5.0 - 8.0 8.5 (A)   Protein, UA     negative mg/dL negative   Urobilinogen, UA     0.2 or 1.0 E.U./dL 0.2   Nitrite, UA     Negative Negative   Leukocytes, UA     Negative Negative     Assessment and Plan Kristin Johnson was seen  today for urinary frequency.  Diagnoses and all orders for this visit:  Frequent urination -     POCT urinalysis dipstick  -  Advised AZO -  Discussed sending culture based on symptoms -  Continue current meds -  Will notify of culture results   Taha Dimond A Creta Levin

## 2018-07-31 LAB — URINE CULTURE

## 2018-09-02 ENCOUNTER — Ambulatory Visit: Payer: BLUE CROSS/BLUE SHIELD | Admitting: Family Medicine

## 2018-09-02 ENCOUNTER — Encounter: Payer: Self-pay | Admitting: Family Medicine

## 2018-09-02 VITALS — BP 126/81 | HR 95 | Temp 98.6°F | Ht 66.0 in | Wt 167.0 lb

## 2018-09-02 DIAGNOSIS — N898 Other specified noninflammatory disorders of vagina: Secondary | ICD-10-CM

## 2018-09-02 DIAGNOSIS — N760 Acute vaginitis: Secondary | ICD-10-CM

## 2018-09-02 DIAGNOSIS — B9689 Other specified bacterial agents as the cause of diseases classified elsewhere: Secondary | ICD-10-CM

## 2018-09-02 LAB — POCT WET + KOH PREP
Trich by wet prep: ABSENT
Yeast by KOH: ABSENT
Yeast by wet prep: ABSENT

## 2018-09-02 MED ORDER — METRONIDAZOLE 0.75 % VA GEL: 1.0000 | Freq: Every day | VAGINAL | 0 refills | Status: DC

## 2018-09-02 MED ORDER — AZITHROMYCIN 250 MG PO TABS: 1000.0000 mg | ORAL_TABLET | Freq: Once | ORAL | 0 refills | Status: AC

## 2018-09-02 NOTE — Patient Instructions (Addendum)
   If you have lab work done today you will be contacted with your lab results within the next 2 weeks.  If you have not heard from us then please contact us. The fastest way to get your results is to register for My Chart.   IF you received an x-ray today, you will receive an invoice from [facility]. Please contact [facility] at [phone] with questions or concerns regarding your invoice.   IF you received labwork today, you will receive an invoice from [facility]. Please contact [facility] at [phone] with questions or concerns regarding your invoice.   Our billing staff will not be able to assist you with questions regarding bills from these companies.  You will be contacted with the lab results as soon as they are available. The fastest way to get your results is to activate your My Chart account. Instructions are located on the last page of this paperwork. If you have not heard from us regarding the results in 2 weeks, please contact this office.      Bacterial Vaginosis Bacterial vaginosis is an infection of the vagina. It happens when too many germs (bacteria) grow in the vagina. This infection puts you at risk for infections from sex (STIs). Treating this infection can lower your risk for some STIs. You should also treat this if you are pregnant. It can cause your baby to be born early. Follow these instructions at home: Medicines  Take over-the-counter and prescription medicines only as told by your doctor.  Take or use your antibiotic medicine as told by your doctor. Do not stop taking or using it even if you start to feel better. General instructions  If you your sexual partner is a woman, tell her that you have this infection. She needs to get treatment if she has symptoms. If you have a female partner, he does not need to be treated.  During treatment: ? Avoid sex. ? Do not douche. ? Avoid alcohol as told. ? Avoid breastfeeding as told.  Drink  enough fluid to keep your pee (urine) clear or pale yellow.  Keep your vagina and butt (rectum) clean. ? Wash the area with warm water every day. ? Wipe from front to back after you use the toilet.  Keep all follow-up visits as told by your doctor. This is important. Preventing this condition  Do not douche.  Use only warm water to wash around your vagina.  Use protection when you have sex. This includes: ? Latex condoms. ? Dental dams.  Limit how many people you have sex with. It is best to only have sex with the same person (be monogamous).  Get tested for STIs. Have your partner get tested.  Wear underwear that is cotton or lined with cotton.  Avoid tight pants and pantyhose. This is most important in summer.  Do not use any products that have nicotine or tobacco in them. These include cigarettes and e-cigarettes. If you need help quitting, ask your doctor.  Do not use illegal drugs.  Limit how much alcohol you drink. Contact a doctor if:  Your symptoms do not get better, even after you are treated.  You have more discharge or pain when you pee (urinate).  You have a fever.  You have pain in your belly (abdomen).  You have pain with sex.  Your bleed from your vagina between periods. Summary  This infection happens when too many germs (bacteria) grow in the vagina.  Treating this condition can   lower your risk for some infections from sex (STIs).  You should also treat this if you are pregnant. It can cause early (premature) birth.  Do not stop taking or using your antibiotic medicine even if you start to feel better. This information is not intended to replace advice given to you by your health care provider. Make sure you discuss any questions you have with your health care provider. Document Released: 08/28/2008 Document Revised: 08/04/2016 Document Reviewed: 08/04/2016 Elsevier Interactive Patient Education  2017 [employer]  

## 2018-09-02 NOTE — Progress Notes (Signed)
10/1/20194:18 PM  Kristin Johnson [birth year], 32 y.o. female [id number]  Chief Complaint  Patient presents with  . Vaginal Discharge    since Tuesday, went out of town, got in hotel pool having discharge every since    HPI:   Patient is a 31 y.o. female who presents today for vaginal discharge  Vaginal discharge with odor  Had just finished her period Not having any discomfort , not having any pain with intercourse Reports vaginal discharge as normal clear white thin Odor smells like "dead body" x 1 week Does not douche, no new detergents, soaps, etc Has not tried any treatments No dysuria, nausea, vomiting, fever, chills Reports no new sex partners Does not want STD testing but requesting rx for azithromycin  Fall Risk  09/02/2018 07/30/2018 05/14/2018 06/24/2017 10/04/2016  Falls in the past year? No No No No No     Depression screen [facility] 2/9 09/02/2018 07/30/2018 05/14/2018  Decreased Interest 0 0 0  Down, Depressed, Hopeless 0 0 0  PHQ - 2 Score 0 0 0  Altered sleeping - - -  Tired, decreased energy - - -  Change in appetite - - -  Feeling bad or failure about yourself  - - -  Trouble concentrating - - -  Moving slowly or fidgety/restless - - -  Suicidal thoughts - - -  PHQ-9 Score - - -  Difficult doing work/chores - - -    No Known Allergies  Prior to Admission medications   Medication Sig Start Date End Date Taking? Authorizing Provider  Glatiramer Acetate 40 MG/ML SOSY glatiramer 40 mg/mL subcutaneous syringe   Yes [provider]    Past Medical History:  Diagnosis Date  . Anxiety   . Headache   . Multiple sclerosis (HCC)   . Syncope and collapse   . Vision abnormalities     History reviewed. No pertinent surgical history.  Social History   Tobacco Use  . Smoking status: Never Smoker  . Smokeless tobacco: Never Used  Substance Use Topics  . Alcohol use: Yes    Comment: weekly    Family History  Problem Relation Age of Onset  . Lung  cancer Mother   . Heart attack Father   . Heart failure Father   . Kidney Stones Father   . Heart attack Sister   . Heart disease Brother   . Bell's palsy Brother     ROS Per hpi  OBJECTIVE:  Blood pressure 126/81, pulse 95, temperature 98.6 F (37 C), temperature source Oral, height 5\' 6"  (1.676 m), weight 167 lb (75.8 kg), last menstrual period 08/22/2018, SpO2 98 %. Body mass index is 26.95 kg/m.   Wt Readings from Last 3 Encounters:  09/02/18 167 lb (75.8 kg)  07/30/18 173 lb 9.6 oz (78.7 kg)  05/14/18 174 lb (78.9 kg)   on keto diet  Physical Exam  Constitutional: She is oriented to person, place, and time. She appears well-developed and well-nourished.  HENT:  Head: Normocephalic and atraumatic.  Mouth/Throat: Mucous membranes are normal.  Eyes: Pupils are equal, round, and reactive to light. Conjunctivae and EOM are normal. No scleral icterus.  Neck: Neck supple.  Pulmonary/Chest: Effort normal.  Neurological: She is alert and oriented to person, place, and time.  Skin: Skin is warm and dry.  Psychiatric: She has a normal mood and affect.  Nursing note and vitals reviewed.   Results for orders placed or performed in visit on 09/02/18 (from the past 24 hour(s))  POCT Wet + KOH Prep     Status: Abnormal   Collection Time: 09/02/18  4:33 PM  Result Value Ref Range   Yeast by KOH Absent Absent   Yeast by wet prep Absent Absent   WBC by wet prep None (A) Few   Clue Cells Wet Prep HPF POC Moderate (A) None   Trich by wet prep Absent Absent   Bacteria Wet Prep HPF POC Many (A) Few   Epithelial Cells By [employer] ([facility]) Few None, Few, Too numerous to count   RBC,UR,HPF,POC None None RBC/hpf    ASSESSMENT and PLAN  1. Bacterial vaginosis Discussed supportive measures, new meds r/se/b and RTC precautions. Patient educational handout given. Also sent azi as requested by patient in case she has been informed + chlamydia and does not want to divulge.   2. Vaginal  discharge - POCT Wet + KOH Prep  Other orders - azithromycin (ZITHROMAX) 250 MG tablet; Take 4 tablets (1,000 mg total) by mouth once for 1 dose. - metroNIDAZOLE (METROGEL) 0.75 % vaginal gel; Place 1 Applicatorful vaginally at bedtime.   Gets very nauseous and gags with oral flagyl due to taste  Return if symptoms worsen or fail to improve.    Myles Lipps, MD Primary Care at [facility] [address] [place], [place] [address] Ph.  [phone] Fax [phone]

## 2018-09-03 ENCOUNTER — Ambulatory Visit: Payer: BLUE CROSS/BLUE SHIELD | Admitting: Neurology

## 2018-09-08 ENCOUNTER — Encounter: Payer: Self-pay | Admitting: Family Medicine

## 2018-09-08 ENCOUNTER — Ambulatory Visit: Payer: Self-pay | Admitting: *Deleted

## 2018-09-08 NOTE — Telephone Encounter (Signed)
Pt left MYchart message this am reporting symptoms. States onset Saturday, worsening Sunday and today. "Cottage cheese like discharge, burning and itching." States burning "Inside vagina." No rash, no other symptoms. States "It's from the MetroGel." Requesting medication be called in :  [facility] at [address], [place], [place] [address], [phone].      Reason for Disposition . [1] Symptoms of a "yeast infection" (i.e., itchy, white discharge, not bad smelling) AND [2] not improved > 3 days following CARE ADVICE  Answer Assessment - Initial Assessment Questions 1. DISCHARGE: "Describe the discharge." (e.g., white, yellow, green, gray, foamy, cottage cheese-like)     Cottage cheese like 2. ODOR: "Is there a bad odor?"     no 3. ONSET: "When did the discharge begin?"     After using Metrogel for BV 4. RASH: "Is there a rash in that area?" If so, ask: "Describe it." (e.g., redness, blisters, sores, bumps)     No, Burning inside, severe itching 5. ABDOMINAL PAIN: "Are you having any abdominal pain?" If yes: "What does it feel like? " (e.g., crampy, dull, intermittent, constant)      no 6. ABDOMINAL PAIN SEVERITY: If present, ask: "How bad is it?"  (e.g., mild, moderate, severe)  - MILD - doesn't interfere with normal activities   - MODERATE - interferes with normal activities or awakens from sleep   - SEVERE - patient doesn't want to move (R/O peritonitis)      N/a 7. CAUSE: "What do you think is causing the discharge?" "Have you had the same problem before? What happened then?"    metrogel 8. OTHER SYMPTOMS: "Do you have any other symptoms?" (e.g., fever, itching, vaginal bleeding, pain with urination, injury to genital area, vaginal foreign body)     Itching, burning "Inside vagina" 9. PREGNANCY: "Is there any chance you are pregnant?" "When was your last menstrual period?"     No.  Protocols used: VAGINAL DISCHARGE-A-AH

## 2018-09-09 ENCOUNTER — Other Ambulatory Visit: Payer: Self-pay | Admitting: *Deleted

## 2018-09-09 DIAGNOSIS — B373 Candidiasis of vulva and vagina: Secondary | ICD-10-CM

## 2018-09-09 DIAGNOSIS — B3731 Acute candidiasis of vulva and vagina: Secondary | ICD-10-CM

## 2018-09-09 MED ORDER — FLUCONAZOLE 150 MG PO TABS: 150.0000 mg | ORAL_TABLET | Freq: Once | ORAL | 0 refills | Status: AC

## 2018-09-30 ENCOUNTER — Telehealth: Payer: Self-pay | Admitting: *Deleted

## 2018-09-30 NOTE — Telephone Encounter (Signed)
Scheduled patient with Dr. Epimenio Foot on 10-06-18 at 2pm check in at 1:30pm.

## 2018-09-30 NOTE — Telephone Encounter (Signed)
Called, LVM for pt to call office back.   Can offer work in appt for 10/06/18 at 2pm, check in 1:30pm with Dr. Epimenio Foot if she calls back. That is soonest available we have. If anyone cancels before that we can call her

## 2018-09-30 NOTE — Telephone Encounter (Addendum)
I called pt. She emailed research team at [facility] with the following: "I hope all is well. I'm wondering is there a way to get in touch with Dr. Epimenio Foot through email? I had a family emergency which had me out of town and I missed a few weeks of treatment and now I am having an exacerbation. I've stopped driving because I was scarring myself and now I'm feeling like I did right before I started having the seizures. What can be done? Could he send me the steroids? Please let me know whatever you can. Thank you. Best Regards, Kristin Johnson".    She states her brother had a heart attack recently. She missed quite a few doses of her medication. Once she came back, she restarted the glatiramaer acetate inj. On 09/26/18. She then got hives after restarting the medication. She took benadryl for this. She also took on 09/28/18 and 09/29/18.  This past Sunday, she was driving to the mall and she noticed her reactions were delayed. She is having to think more about things. Yesterday she felt "off". Her normal drive home took her over an hour d/t confusion. Someone is staying with her right now d/t confusion. She is concerned.   She verified she cancelled appt on 09/03/18 with Dr. Epimenio Foot for f/u. She states it is hard for her to get to appt. She lives in [place]. I advised she has not been seen for OV since 07/01/2017. She would need to come in and be seen. She states she can see if her Dad can bring her to appt.  I will speak with Dr. Epimenio Foot and call her back to let her know next steps. She verbalized understanding.

## 2018-09-30 NOTE — Telephone Encounter (Signed)
Noted, Dr. Epimenio Foot is aware.

## 2018-10-06 ENCOUNTER — Ambulatory Visit: Payer: BLUE CROSS/BLUE SHIELD | Admitting: Neurology

## 2018-10-06 ENCOUNTER — Encounter: Payer: Self-pay | Admitting: Neurology

## 2018-10-06 VITALS — BP 117/78 | HR 92 | Ht 66.0 in | Wt 171.0 lb

## 2018-10-06 DIAGNOSIS — G4719 Other hypersomnia: Secondary | ICD-10-CM | POA: Diagnosis not present

## 2018-10-06 DIAGNOSIS — R5383 Other fatigue: Secondary | ICD-10-CM

## 2018-10-06 DIAGNOSIS — R39198 Other difficulties with micturition: Secondary | ICD-10-CM

## 2018-10-06 DIAGNOSIS — G35 Multiple sclerosis: Secondary | ICD-10-CM

## 2018-10-06 NOTE — Progress Notes (Signed)
[facility]  PATIENT: Kristin Johnson DOB: [birth year]  REFERRING DOCTOR OR PCP:  Benny Lennert, PA-C SOURCE: patient  _________________________________   HISTORICAL  CHIEF COMPLAINT:  Chief Complaint  Patient presents with  . Follow-up    RM 12, alone. Last seen 07/01/2017. Reports dizziness, memory problems that are chronic issues. She reports muscle spasms.when she transitions from heat to cold. She is also nauseous when sitting in the back of the car or when she walks a lot.   . Multiple Sclerosis    On glatiramer acetate inj. She reports SE- hives. She takes OTC benadryl for this which is effective.   . Fatigue    Intermittent. She gets 8+ hours of sleep and will still be tired in the morning. Does not happen all the time.     HISTORY OF PRESENT ILLNESS:  Kristin Johnson is a 32 y.o. woman with multiple sclerosis.  Update 10/06/2018: She was in the [id number] MS.  She did have some continued breakthrough activity on MRI.  Therefore, when the blinded portion of the study was over she desired to go back on Copaxone, the medication she was on before the study.  She missed 1 1/2 weeks in a row recently when out of town unexpectedly.  She gets hives every day  However, she had also experienced some breakthrough disease on Copaxone before the drug study but was not interested in switching to a different medication at the time.  She feels her gait is stable and she denies any falls.   She has had a few dizzy spells.   She can walk a couple miles.  Strength is doing well and she has no significant numbness.    Bladder function is doing okay with mild urgency at times.    She notes fatigue but not sleepiness.   It has worsened some.    She is sleeping well 8 hours a night.  She has depression and apathy.    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    Cognition is a little worse.  She notes that processing seems worse.   She also  has some verbal fluency issues.           From 07/01/2017:  In 2011, she had loss of consciousness followed by a seizure.    One of her tests was an MRI and it was worrisome for MS. The diagnosis was confirmed with CSF analysis (reportedly c/w MS).     She started on Copaxone and rarely took shots.    She had a relapse with left arm numbness last year that lasted 2-3 weeks.    Three weeks ago,  she had the onset of vertigo that is slowly improving.   The vertigo is occurring in any position and not changed much with movements.    She presented to her primary care doctor and then the emergency room where she was admitted. MRI of the brain shows several new enhancing lesions including one in the right middle cerebellar peduncle/cerebellum adjacent to the fourth ventricle. The others were in the hemispheres. The cerebellum focus could explain her symptoms. She received several days of IV Solu-Medrol and was recently discharged and a follow-up appointment was made with me.   She feels her gait is ok.   She veers mildly to the right as she walks but leans to the left.  She can climb stirs but feels  dizzy going down them.   She denies weakness and numbness currently.  Last year, she had left arm numbness.  Vision is doing well.   She denies any episodes of diplopia or optic neuritis.   There is no eye pain    She has had some episodes of urge incontinence and notes frequency.    She denies hesitancy.    No recent UTI.  She notes anxiety, partially due to medical issues.   She notes mild depression.   She feels cognition is mildly affected with reduced concentration/focus.   She notes reduced executive function and noteed some problems copying a figure.    She is very fatigued some days, usually worse in the afternoons.  This seemed worse with the recent heat and she had one episode of LOC when more tired.       She has had episodes of a pressure sensation in her head sometimes followed by reduced  awareness but not LOC.   She is able to hear what is being said.     These can occur a few times a day and last 30 seconds or so.  There is no GTC activity, urination or tongue biting.   She feels back to herself quickly.    She only had one GTC seizure in 2011 and she was unresponsive for a short while after that one.   She also reports headaches that will be on one side of the head of the other with a pressure sensation. These come on a few times a week and will last for much of the day. She can get nausea, photophobia and phonophobia with the headache will usually does not get vomiting.    I personally reviewed the MRI of the brain dated 06/24/2017. It shows multiple T2/FLAIR hyperintense foci in the cerebellum, brainstem and hemispheres in a pattern and configuration consistent with MS. Several of the lesions including one in the periventricular right cerebellum/MCP enhances after contrast administration.  The other enhancing lesions are small and in the hemispheres.   REVIEW OF SYSTEMS: Constitutional: No fevers, chills, sweats, or change in appetite.  She notes fatigue and sometimes insomnia. Eyes: No visual changes, double vision, eye pain Ear, nose and throat: No hearing loss, ear pain, nasal congestion, sore throat Cardiovascular: No chest pain, palpitations Respiratory: No shortness of breath at rest or with exertion.   No wheezes GastrointestinaI: No nausea, vomiting, diarrhea, abdominal pain, fecal incontinence Genitourinary: Notes frequency and rare incontinence.  No nocturia. Musculoskeletal: No neck pain, back pain Integumentary: No rash, pruritus, skin lesions Neurological: as above Psychiatric: Mild depression at this time.  Mild anxiety Endocrine: No palpitations, diaphoresis, change in appetite, change in weigh or increased thirst Hematologic/Lymphatic: No anemia, purpura, petechiae. Allergic/Immunologic: No itchy/runny eyes, nasal congestion, recent allergic reactions,  rashes  ALLERGIES: No Known Allergies  HOME MEDICATIONS:  Current Outpatient Medications:  .  Glatiramer Acetate 40 MG/ML SOSY, glatiramer 40 mg/mL subcutaneous syringe, Disp: , Rfl:  .  metroNIDAZOLE (METROGEL) 0.75 % vaginal gel, Place 1 Applicatorful vaginally at bedtime., Disp: 70 g, Rfl: 0  PAST MEDICAL HISTORY: Past Medical History:  Diagnosis Date  . Anxiety   . Dizziness   . Headache   . Multiple sclerosis (HCC)   . Syncope and collapse   . Vision abnormalities     PAST SURGICAL HISTORY: Past Surgical History:  Procedure Laterality Date  . NO PAST SURGERIES      FAMILY HISTORY: Family History  Problem Relation Age of  Onset  . Lung cancer Mother   . Heart attack Father   . Heart failure Father   . Kidney Stones Father   . Heart attack Sister   . Heart disease Brother   . Bell's palsy Brother     SOCIAL HISTORY:  Social History   Socioeconomic History  . Marital status: Single    Spouse name: Not on file  . Number of children: 0  . Years of education: Not on file  . Highest education level: Not on file  Occupational History  . Occupation: [employer]  Social Needs  . Financial resource strain: Not on file  . Food insecurity:    Worry: Not on file    Inability: Not on file  . Transportation needs:    Medical: Not on file    Non-medical: Not on file  Tobacco Use  . Smoking status: Never Smoker  . Smokeless tobacco: Never Used  Substance and Sexual Activity  . Alcohol use: Yes    Comment: weekly- 2-8oz per month  . Drug use: No  . Sexual activity: Yes    Partners: Male    Birth control/protection: Pill  Lifestyle  . Physical activity:    Days per week: Not on file    Minutes per session: Not on file  . Stress: Not on file  Relationships  . Social connections:    Talks on phone: Not on file    Gets together: Not on file    Attends religious service: Not on file    Active member of club or organization: Not on file    Attends meetings of clubs  or organizations: Not on file    Relationship status: Not on file  . Intimate partner violence:    Fear of current or ex partner: Not on file    Emotionally abused: Not on file    Physically abused: Not on file    Forced sexual activity: Not on file  Other Topics Concern  . Not on file  Social History Narrative   Right handed    Caffeine use: 4 cups coffee/tea per day   Lives alone     PHYSICAL EXAM  Vitals:   10/06/18 1404  BP: 117/78  Pulse: 92  Weight: 171 lb (77.6 kg)  Height: 5\' 6"  (1.676 m)    Body mass index is 27.6 kg/m.   General: The patient is well-developed and well-nourished and in no acute distress   Skin: Extremities are without rash or edema.  Neurologic Exam  Mental status: The patient is alert and oriented x 3 at the time of the examination. The patient has apparent normal recent and remote memory, with an apparently normal attention span and concentration ability.   Speech is normal.  Cranial nerves: Extraocular movements are full.  Facial strength is normal.  She has reduced facial sensation on the left.  Trapezius and sternocleidomastoid strength is normal. No dysarthria is noted.  The tongue is midline, and the patient has symmetric elevation of the soft palate. No obvious hearing deficits are noted.  Motor:  Muscle bulk is normal.   Tone is normal. Strength is  5 / 5 in all 4 extremities.   Sensory: Sensory testing shows reduced vibration in her left arm and slight temperature asymmetry, colder in the right hand.    Coordination: Cerebellar testing reveals good finger-nose-finger and heel-to-shin bilaterally.  Gait and station: Station is normal.  Gait is normal.  Tandem gait is slightly wide.  Romberg is negative.  Reflexes: Deep tendon reflexes are increased in her knees with spread but no ankle clonus.        DIAGNOSTIC DATA (LABS, IMAGING, TESTING) - I reviewed patient records, labs, notes, testing and imaging myself where  available.  Lab Results  Component Value Date   WBC 22.8 (H) 06/26/2017   HGB 10.3 (L) 06/26/2017   HCT 32.1 (L) 06/26/2017   MCV 82.3 06/26/2017   PLT 258 06/26/2017      Component Value Date/Time   NA 140 06/26/2017 0643   NA 139 06/24/2017 0940   K 3.6 06/26/2017 0643   CL 116 (H) 06/26/2017 0643   CO2 18 (L) 06/26/2017 0643   GLUCOSE 123 (H) 06/26/2017 0643   BUN 8 06/26/2017 0643   BUN 13 06/24/2017 0940   CREATININE 0.69 06/26/2017 0643   CALCIUM 7.6 (L) 06/26/2017 0643   PROT 5.0 (L) 06/26/2017 0643   PROT 7.3 06/24/2017 0940   ALBUMIN 2.4 (L) 06/26/2017 0643   ALBUMIN 4.3 06/24/2017 0940   AST 14 (L) 06/26/2017 0643   ALT 10 (L) 06/26/2017 0643   ALKPHOS 36 (L) 06/26/2017 0643   BILITOT 0.4 06/26/2017 0643   BILITOT 0.3 06/24/2017 0940   GFRNONAA >60 06/26/2017 0643   GFRAA >60 06/26/2017 0643   No results found for: CHOL, HDL, LDLCALC, LDLDIRECT, TRIG, CHOLHDL No results found for: CBJS2G Lab Results  Component Value Date   VITAMINB12 324 06/25/2017   Lab Results  Component Value Date   TSH 0.966 10/03/2015       ASSESSMENT AND PLAN  Multiple sclerosis (HCC)  Other fatigue  Urinary dysfunction  Excessive daytime sleepiness   1.    Continue Copaxone/glatiramer 40 mg 3 times a week.  Early next year, we will check another MRI of the brain to determine if she is having subclinical progression.  If present after being on Copaxone for more than 6 months, then I would consider her as failing the drug and I would want her to go on a different DMT 2.    Try 1/2 armodafinil,     If still has trouble tolerating it, consider low dose Adderall(i.e XR 10 mg) 3.    Stay active. 4.     Return to see me in 4 months or sooner if there are new or worsening neurologic symptoms.    Richard A. Epimenio Foot, MD, PhD, [place] 10/06/2018, 2:57 PM Certified in Neurology, Clinical Neurophysiology, Sleep Medicine, Pain Medicine and Neuroimaging Dir., [facility]  [facility] [address], [address] [place], [place] [address] [phone]

## 2018-10-07 ENCOUNTER — Telehealth: Payer: Self-pay | Admitting: Neurology

## 2018-10-07 NOTE — Telephone Encounter (Signed)
same day as his office visit w/Dr. Epimenio Foot 02/09/19 lvm for pt to be aware of this. I also informed her if she has not heard from the them in the next 2-3 business days to give them a call at [phone].

## 2018-11-04 IMAGING — MR MR HEAD WO/W CM
14 series · 48 of 48 positions shown · IV contrast (multihance)
Comparison: None.

CLINICAL DATA: Multiple sclerosis. New neurologic symptoms with
migraine headaches, loss of cord nasion balance, and stuttering
speech. Difficulty concentrating and comprehending. Lapses of
judgement. Symptoms worse over the last 2 weeks.

EXAM:
MRI HEAD WITHOUT AND WITH CONTRAST
TECHNIQUE: Multiplanar, multiecho pulse sequences of the brain and surrounding
structures were obtained without and with intravenous contrast.
CONTRAST:  15 mL MultiHance

[Series 2: T1 · sagittal · 5.0mm · 0.45mm/px · 1 of 25 slices shown (1 of 2)]
[im 1/25]
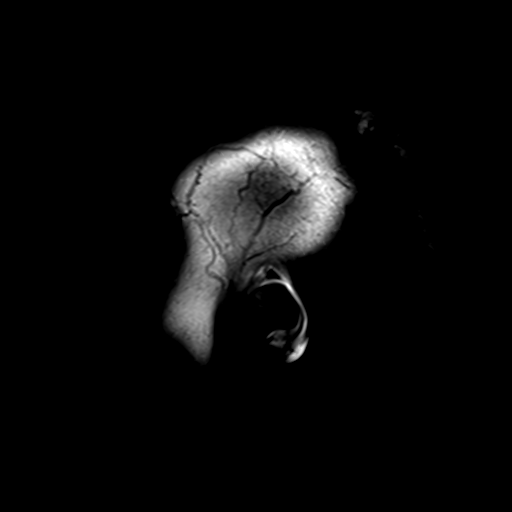

[Series 4: DWI · axial · 3.0mm · 1.80mm/px · z∈[-77,+82]mm · 2 of 53 slices shown (1 of 2)]
[im 1/53]
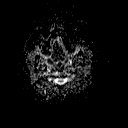
[im 53/53]
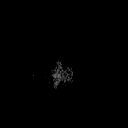

[Series 6: DWI · coronal · 3.0mm · 1.80mm/px · 3 of 45 slices shown (2 of 2)]
[im 1/45]
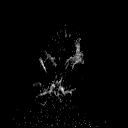
[im 23/45]
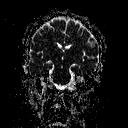
[im 45/45]
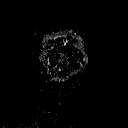

[Series 7: T2 · axial · 5.0mm · 0.60mm/px · z∈[-75,+81]mm · 2 of 25 slices shown (1 of 2)]
[im 1/25]
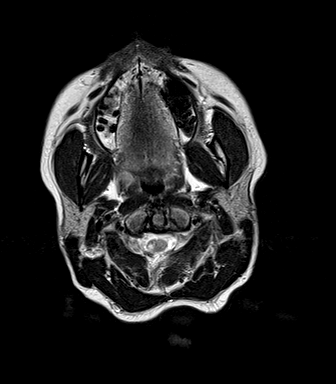
[im 25/25]
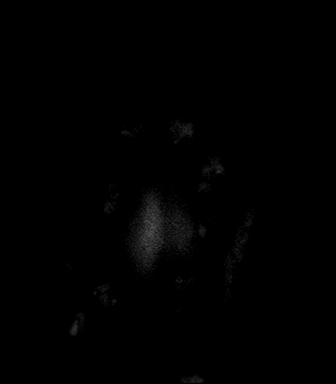

[Series 8: FLAIR · axial · 3.0mm · 0.45mm/px · z∈[-75,+81]mm · 3 of 53 slices shown (1 of 2)]
[im 1/53]
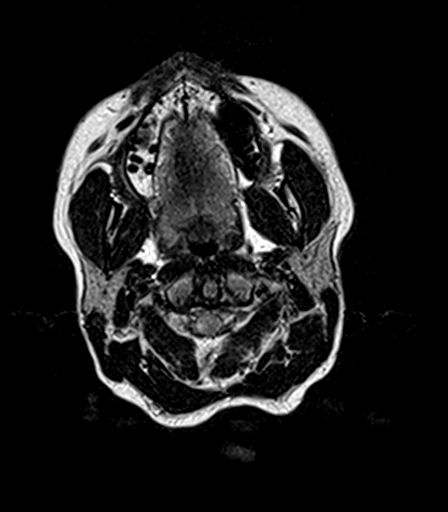
[im 27/53]
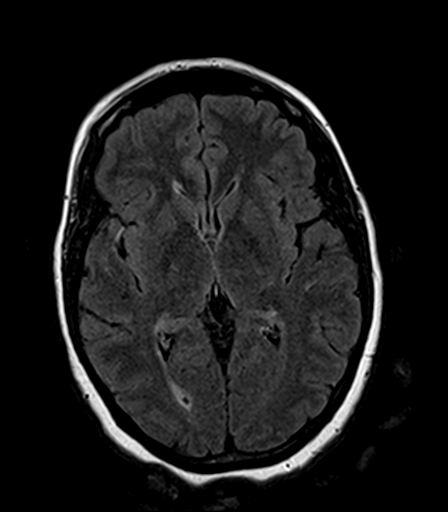
[im 53/53]
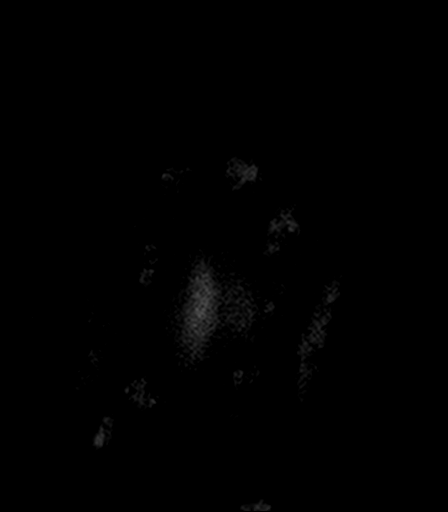

[Series 9: FLAIR · sagittal · 5.0mm · 0.43mm/px · 1 of 23 slices shown (2 of 2)]
[im 1/23]
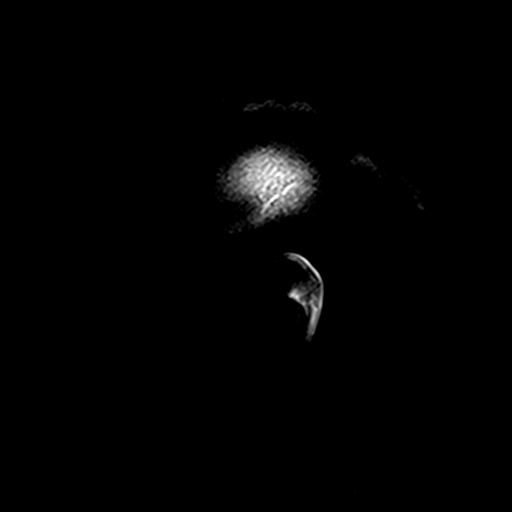

[Series 10: T2 · axial · 5.0mm · 0.45mm/px · z∈[-75,+81]mm · 2 of 25 slices shown (2 of 2)]
[im 1/25]
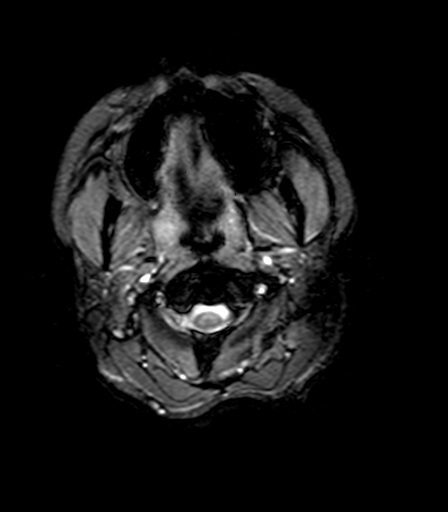
[im 25/25]
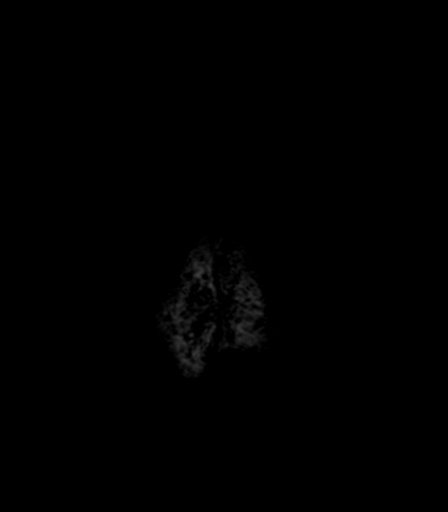

[Series 11: T1 · axial · 1.0mm · 1.00mm/px · z∈[-85,+89]mm · 11 of 176 slices shown (2 of 2)]
[im 1/176]
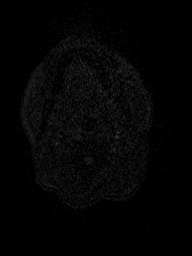
[im 18/176]
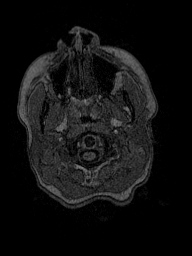
[im 36/176]
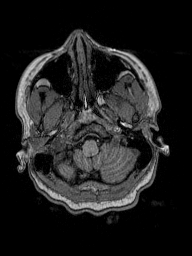
[im 53/176]
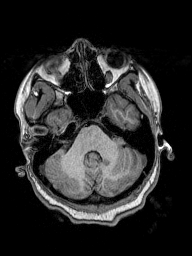
[im 71/176]
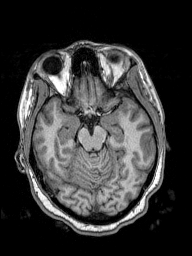
[im 88/176]
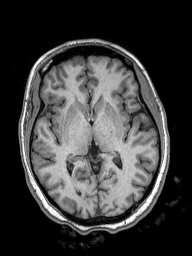
[im 106/176]
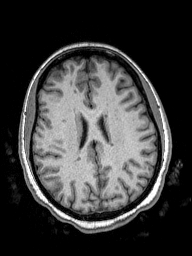
[im 123/176]
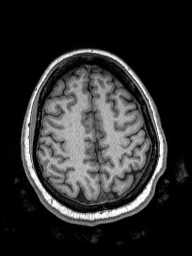
[im 141/176]
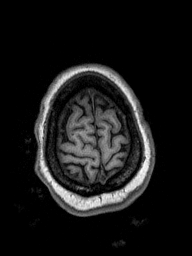
[im 158/176]
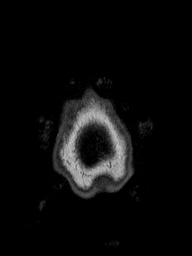
[im 176/176]
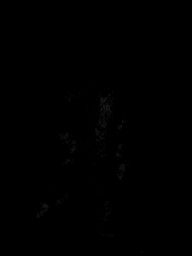

[Series 12: T2 post-contrast · coronal · 5.0mm · 0.49mm/px · 2 of 27 slices shown]
[im 1/27]
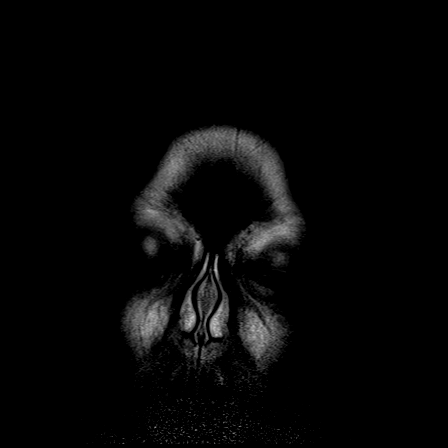
[im 27/27]
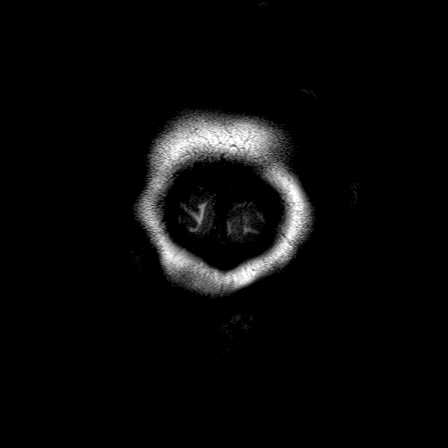

[Series 13: T1 post-contrast · axial · 1.0mm · 1.00mm/px · z∈[-85,+89]mm · 11 of 176 slices shown (1 of 3)]
[im 1/176]
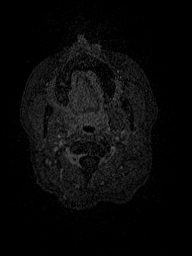
[im 18/176]
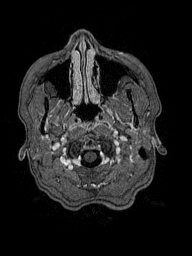
[im 36/176]
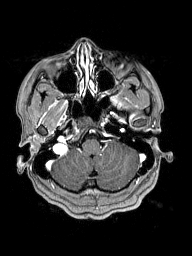
[im 53/176]
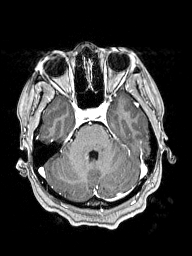
[im 71/176]
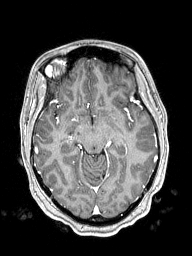
[im 88/176]
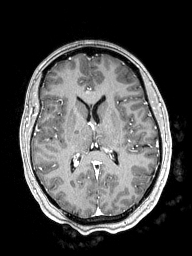
[im 106/176]
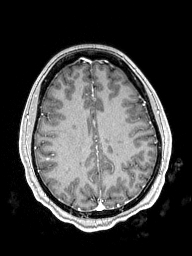
[im 123/176]
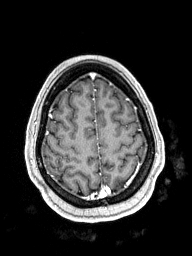
[im 141/176]
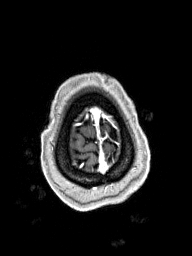
[im 158/176]
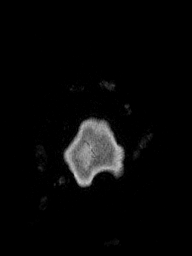
[im 176/176]
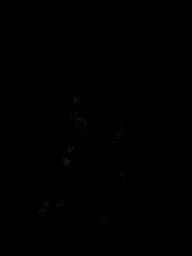

[Series 14: T1 post-contrast · coronal · 5.0mm · 0.43mm/px · 2 of 27 slices shown (2 of 3)]
[im 1/27]
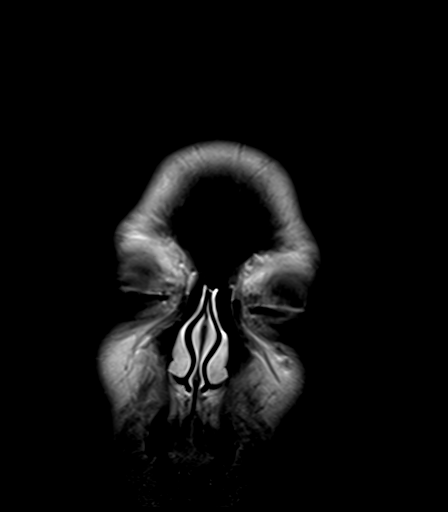
[im 27/27]
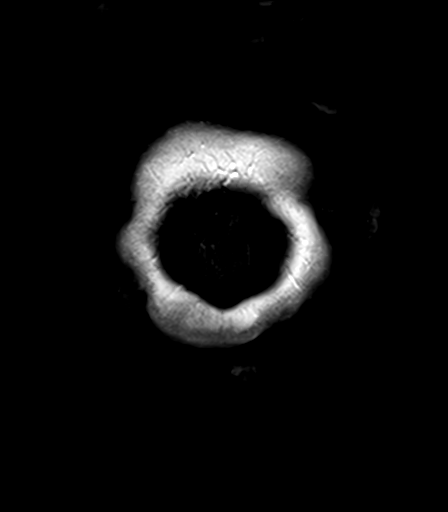

[Series 15: T1 post-contrast · sagittal · 5.0mm · 0.45mm/px · 2 of 25 slices shown (3 of 3)]
[im 1/25]
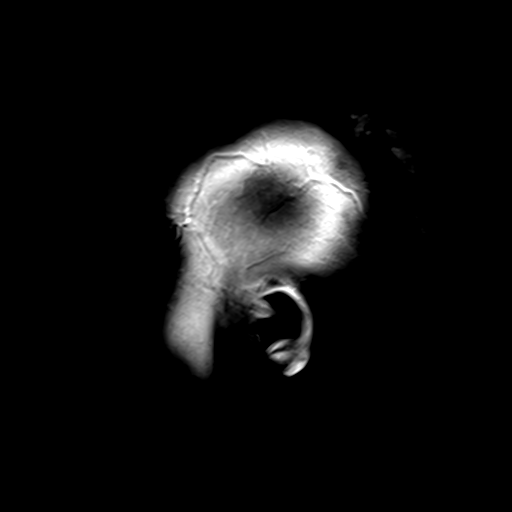
[im 25/25]
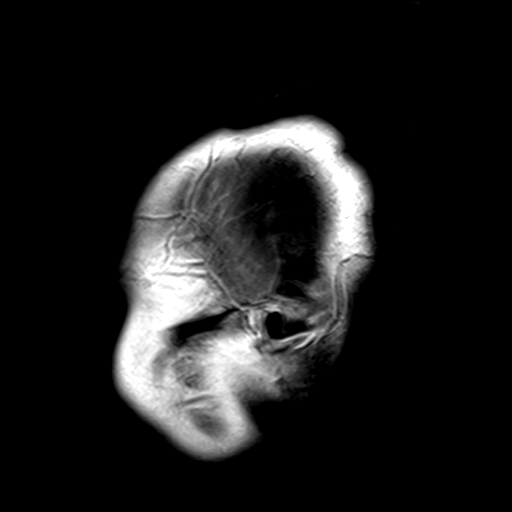

[Series 100: ax (id) · axial · 3.0mm · 1.80mm/px · z∈[-74,+85]mm · 3 of 54 slices shown]
[im 1/54]
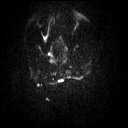
[im 27/54]
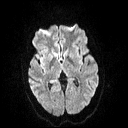
[im 54/54]
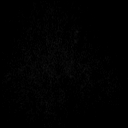

[Series 101: cor (id) · coronal · 3.0mm · 1.80mm/px · 3 of 44 slices shown]
[im 1/44]
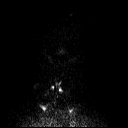
[im 22/44]
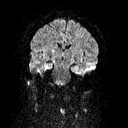
[im 44/44]
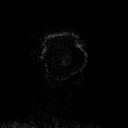

[48 of 48 positions shown; findings below may reference images not displayed]

FINDINGS: Brain: Multiple periventricular T2 hyperintensities are associated
with the corpus callosum, typical of a demyelinating process. A 7 mm
focus is present within the right internal capsule. A 7 mm focus is
present superior and lateral to the atrium of the left lateral
ventricle. There is no significant restricted diffusion associated
with these lesions.

A 6 mm lesion in the right frontal lobe on image 95 of series 13
enhances. There is subtle enhancement of the lesion along the right
side of the fourth ventricle. A punctate focus is present in the
right parietal lobe on image 105. A punctate focus of enhancement is
evident in the subcortical left parietal lobe on image 111 of series
13 . A 4 mm enhancing lesion is noted in the subcortical right
frontal lobe on image 118. Additional punctate subcortical focus of
enhancement is present in the high right frontal lobe near the
vertex on image 134.

Vascular: Flow is present in the major intracranial arteries.

Skull and upper cervical spine: The skullbase is within normal
limits. The craniocervical junction is normal. The upper cervical
spine is unremarkable.

Sinuses/Orbits: The paranasal sinuses are clear. The mastoid air
cells are clear. The globes and orbits are within normal limits.
IMPRESSION: 1. Multiple T2 hyperintense lesions in a pattern compatible with
multiple sclerosis.
2. At least 6 of these lesions enhance suggesting new lesions or
active demyelination. No restricted diffusion is present.
3. Prominent nonenhancing lesions are present in the right internal
capsule and posterior and lateral to the atrium of the left lateral
ventricle.
4. No acute or subacute infarct.

## 2018-11-05 IMAGING — DX DG CHEST 2V
2 series · 2 of 2 positions shown · non-contrast
Comparison: None.

CLINICAL DATA: Current history of multiple sclerosis, presenting
with shortness of breath.

EXAM:
CHEST  2 VIEW

[x chest ap]
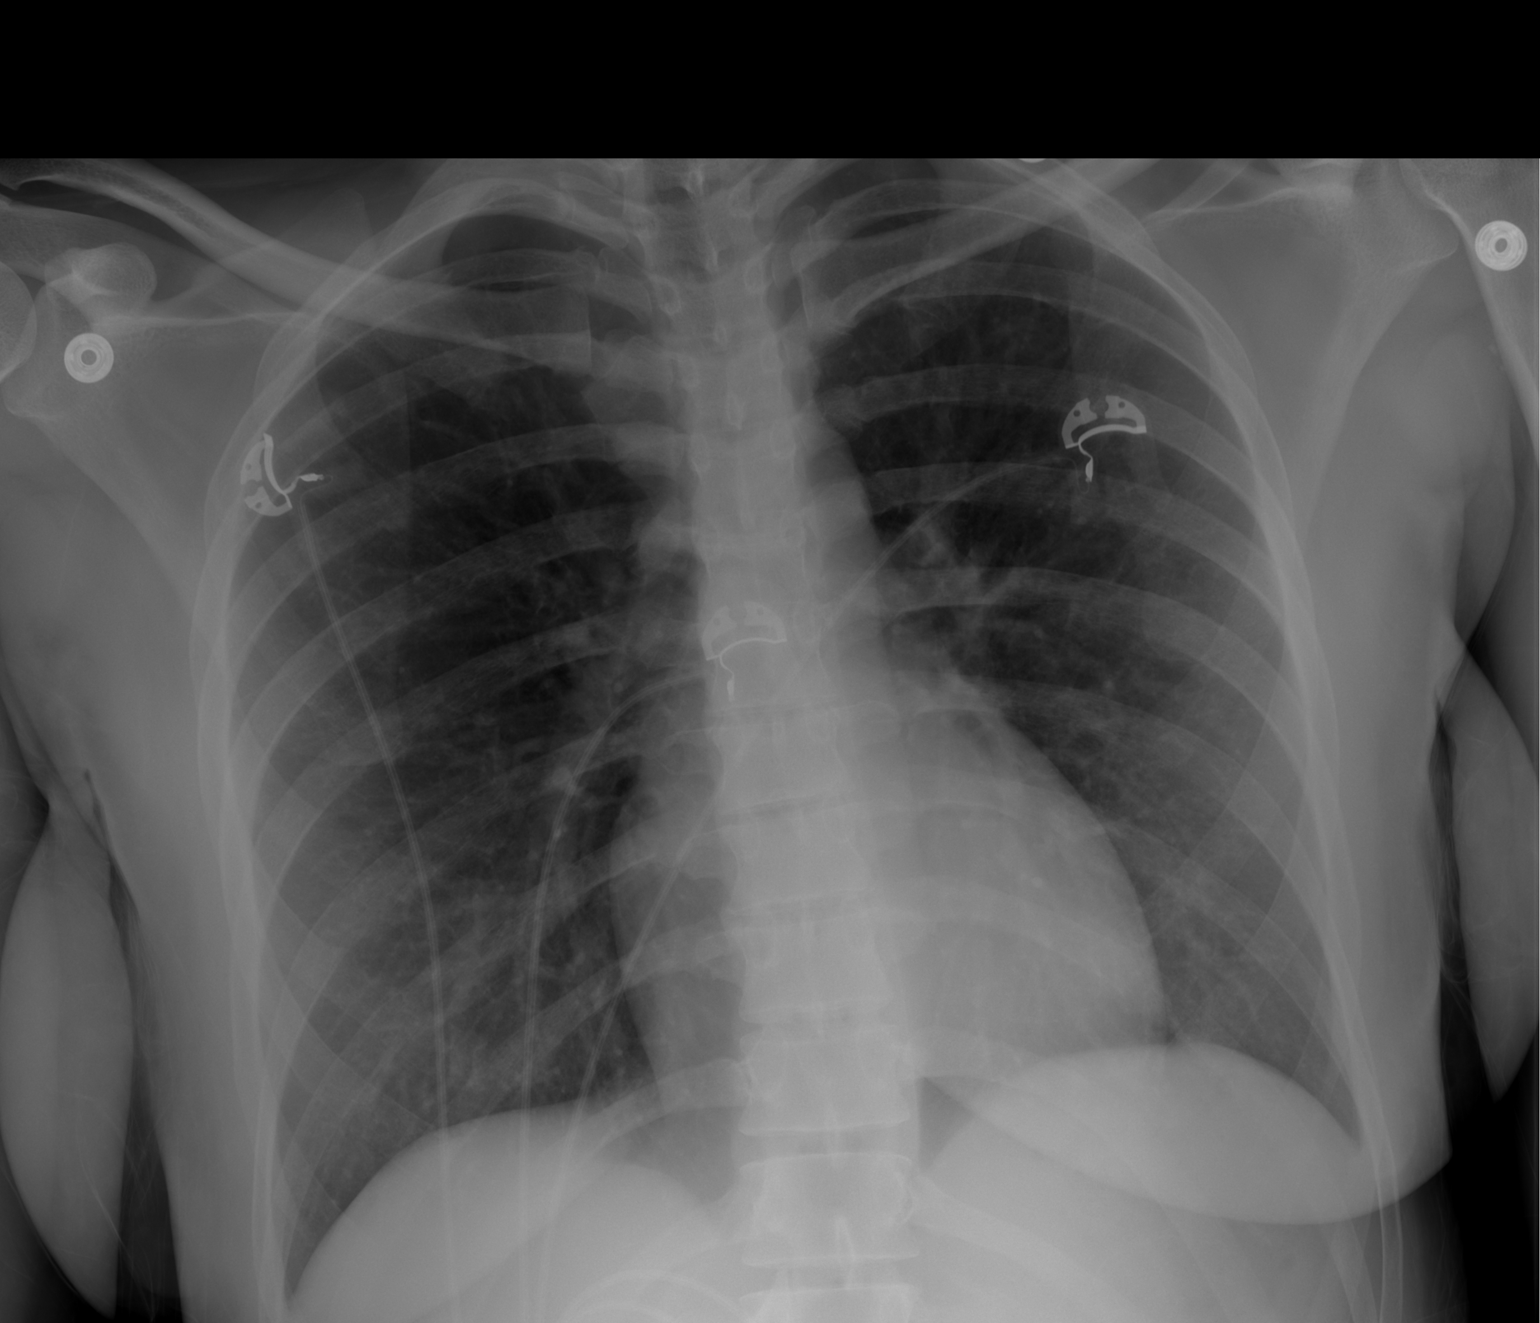

[w chest lat]
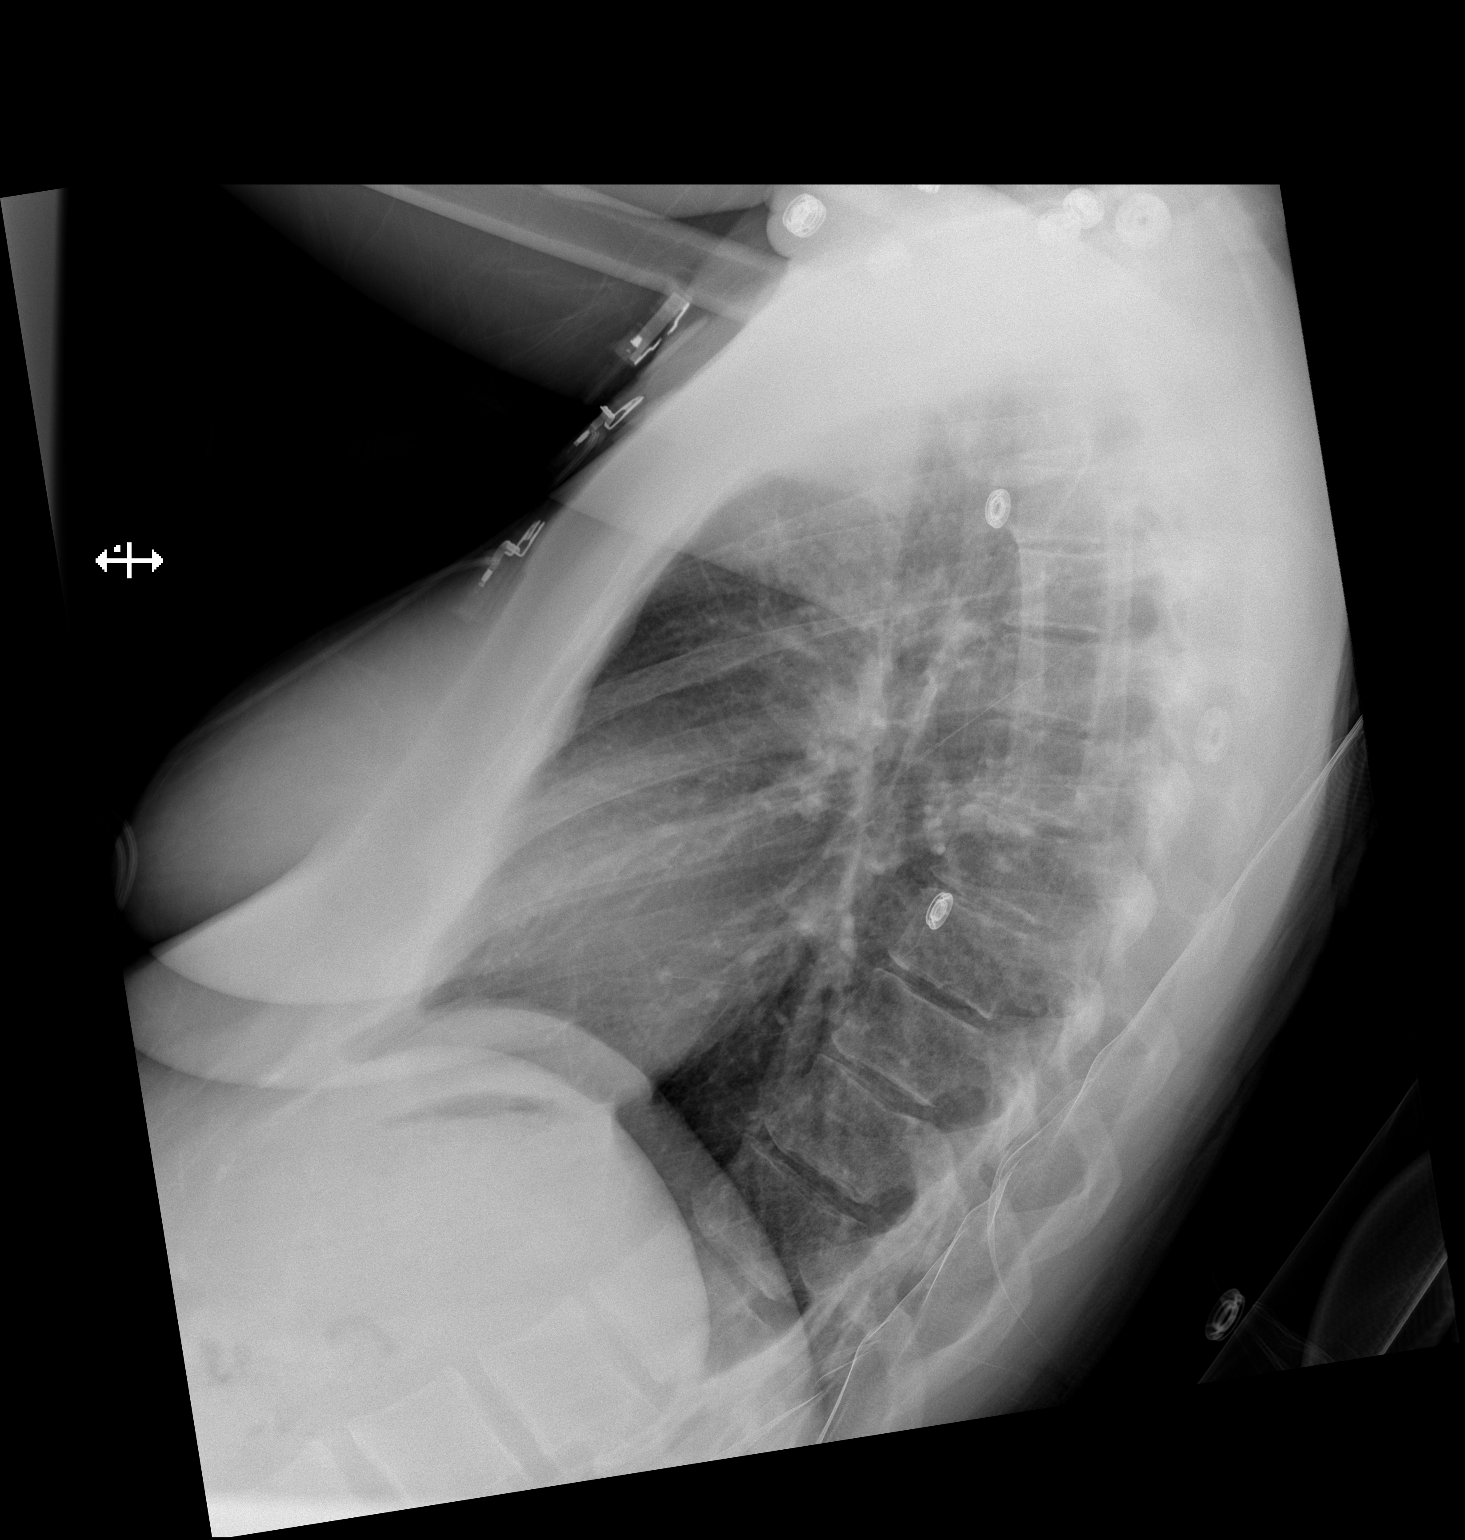

[2 of 2 positions shown; findings below may reference images not displayed]

FINDINGS: AP erect and lateral images were obtained. Cardiomediastinal
silhouette unremarkable. Lungs clear. Bronchovascular markings
normal. Pulmonary vascularity normal. No visible pleural effusions.
No pneumothorax. Visualized bony thorax intact.
IMPRESSION: No acute cardiopulmonary disease.

## 2018-12-10 ENCOUNTER — Encounter: Payer: BLUE CROSS/BLUE SHIELD | Admitting: Family Medicine

## 2018-12-12 ENCOUNTER — Ambulatory Visit: Payer: BLUE CROSS/BLUE SHIELD | Admitting: Family Medicine

## 2019-02-04 ENCOUNTER — Inpatient Hospital Stay: Admission: RE | Admit: 2019-02-04 | Payer: Self-pay | Source: Ambulatory Visit

## 2019-02-09 ENCOUNTER — Ambulatory Visit: Payer: BLUE CROSS/BLUE SHIELD | Admitting: Neurology

## 2019-05-06 ENCOUNTER — Telehealth: Payer: Self-pay | Admitting: *Deleted

## 2019-05-06 NOTE — Telephone Encounter (Addendum)
Called, LVM for pt to call office to schedule f/u asap. We received fax request from [facility] for refill on glatiramer acetate 40mg /ml syringe.  She is past due for f/u. Last saw Dr. Epimenio Foot 10/2018 and he wanted her to f/u in 4 months after last appt. Appears she cx f/u 02/2019 d.t not having copay.

## 2019-05-07 NOTE — Telephone Encounter (Signed)
Called pt again. Scheduled f/u for 05/11/19 at 2:30pm. Pt requested appt after 2pm. She has a special needs child per pt and therapy goes until 2pm. She consented to virtual visit .  Pt understands that although there may be some limitations with this type of visit, we will take all precautions to reduce any security or privacy concerns.  Pt understands that this will be treated like an in office visit and we will file with pt's insurance, and there may be a patient responsible charge related to this service.  Emailed her link at [email or web address]. Confirmed she received while on the phone. Updated med list, pharmacy, allergies on file. Transferred her to Seth Bake to get her new insurance information.  She has 3 weeks worth of glatiramer left. Advised we can refill after she sees Dr. Epimenio Foot Monday.

## 2019-05-11 ENCOUNTER — Other Ambulatory Visit: Payer: Self-pay

## 2019-05-11 ENCOUNTER — Encounter: Payer: Self-pay | Admitting: Neurology

## 2019-05-11 ENCOUNTER — Ambulatory Visit (INDEPENDENT_AMBULATORY_CARE_PROVIDER_SITE_OTHER): Payer: BLUE CROSS/BLUE SHIELD | Admitting: Neurology

## 2019-05-11 DIAGNOSIS — R5383 Other fatigue: Secondary | ICD-10-CM

## 2019-05-11 DIAGNOSIS — E559 Vitamin D deficiency, unspecified: Secondary | ICD-10-CM

## 2019-05-11 DIAGNOSIS — G35 Multiple sclerosis: Secondary | ICD-10-CM | POA: Diagnosis not present

## 2019-05-11 MED ORDER — GLATIRAMER ACETATE 40 MG/ML ~~LOC~~ SOSY: PREFILLED_SYRINGE | SUBCUTANEOUS | 11 refills | Status: DC

## 2019-05-11 NOTE — Progress Notes (Signed)
[facility]  PATIENT: Kristin Johnson DOB: 1986/08/01  REFERRING DOCTOR OR PCP:  Benny Lennert, PA-C SOURCE: patient  _________________________________   HISTORICAL  CHIEF COMPLAINT:  Chief Complaint  Patient presents with   Multiple Sclerosis    On glatiramer    HISTORY OF PRESENT ILLNESS:  Kristin Johnson is a 33 y.o. woman with multiple sclerosis.  Update  05/11/2019: Virtual Visit via Video Note I connected with ,Emilia Beck on 05/11/19 at  2:30 PM EDT by a video enabled telemedicine application and verified that I am speaking with the correct person.  I discussed the limitations of evaluation and management by telemedicine and the availability of in person appointments. The patient expressed understanding and agreed to proceed.  History of Present Illness: She is generally feeling good and denies any new exacerbation or other new MS symptoms.  She is on glatiramer.  She tolerates it well and has not had any exacerbation since starting.  Before that, she was on Vumerity as part of the drug study.  Her last MRI 03/31/2018 showed multiple new enhancing lesions prompting a switch off of [id number] (Vumerity).  Images were personally reviewed.  She opted to go on Copaxone due to safety concerns.  She feels her gait is doing well.  She can climb stairs.  Balance is slightly off at times.  She denies any numbness or weakness.  She does have fatigue though it is better than last year.  She is sleeping well most nights.  Has done well though she does have some mild anxiety and depression with current events.  She continues to note some mild cognitive issues especially word finding.   Observations/Objective: She is a well-developed well-nourished woman in no acute distress.  The head is normocephalic and atraumatic.  Sclera are anicteric.  Visible skin appears normal.  The neck has a good range of motion.  Pharynx and tongue have normal appearance.  She is alert and fully  oriented with fluent speech and good attention, knowledge and memory.  Extraocular muscles are intact.  Facial strength is normal.  Palatal elevation and tongue protrusion are midline.  She appears to have normal strength in the arms.  Rapid alternating movements and finger-nose-finger are performed well.   Assessment and Plan: Vitamin D deficiency  Multiple sclerosis (HCC) - Plan: MR BRAIN W WO CONTRAST  Other fatigue  1.  Continue glatiramer.  We need to check an MRI of the brain to determine if there is any subclinical progression.  If present, consider a switch to a different disease modifying therapy.  A new prescription was sent in. 2.   Continue OTC vitamin D. 3.   Stay active and exercise as tolerated.  Fatigue worsens we can call in more armodafinil. 4.   Return to see me in 6 months or sooner if there are new or worsening neurologic symptoms  Follow Up Instructions: I discussed the assessment and treatment plan with the patient. The patient was provided an opportunity to ask questions and all were answered. The patient agreed with the plan and demonstrated an understanding of the instructions.    The patient was advised to call back or seek an in-person evaluation if the symptoms worsen or if the condition fails to improve as anticipated.  I provided a 25 minutes of non-face-to-face time during this encounter.  ________________________________________________  Update 10/06/2018: She was in the [id number] MS.  She did have some continued breakthrough activity on MRI.  Therefore, when the blinded portion  of the study was over she desired to go back on Copaxone, the medication she was on before the study.  She missed 1 1/2 weeks in a row recently when out of town unexpectedly.  She gets hives every day  However, she had also experienced some breakthrough disease on Copaxone before the drug study but was not interested in switching to a different medication at the time.  She feels her  gait is stable and she denies any falls.   She has had a few dizzy spells.   She can walk a couple miles.  Strength is doing well and she has no significant numbness.    Bladder function is doing okay with mild urgency at times.    She notes fatigue but not sleepiness.   It has worsened some.    She is sleeping well 8 hours a night.  She has depression and apathy.    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    Cognition is a little worse.  She notes that processing seems worse.   She also has some verbal fluency issues.           From 07/01/2017:  In 2011, she had loss of consciousness followed by a seizure.    One of her tests was an MRI and it was worrisome for MS. The diagnosis was confirmed with CSF analysis (reportedly c/w MS).     She started on Copaxone and rarely took shots.    She had a relapse with left arm numbness last year that lasted 2-3 weeks.    Three weeks ago,  she had the onset of vertigo that is slowly improving.   The vertigo is occurring in any position and not changed much with movements.    She presented to her primary care doctor and then the emergency room where she was admitted. MRI of the brain shows several new enhancing lesions including one in the right middle cerebellar peduncle/cerebellum adjacent to the fourth ventricle. The others were in the hemispheres. The cerebellum focus could explain her symptoms. She received several days of IV Solu-Medrol and was recently discharged and a follow-up appointment was made with me.   She feels her gait is ok.   She veers mildly to the right as she walks but leans to the left.  She can climb stirs but feels dizzy going down them.   She denies weakness and numbness currently.  Last year, she had left arm numbness.  Vision is doing well.   She denies any episodes of diplopia or optic neuritis.   There is no eye pain    She has had some episodes of urge incontinence and notes frequency.    She  denies hesitancy.    No recent UTI.  She notes anxiety, partially due to medical issues.   She notes mild depression.   She feels cognition is mildly affected with reduced concentration/focus.   She notes reduced executive function and noteed some problems copying a figure.    She is very fatigued some days, usually worse in the afternoons.  This seemed worse with the recent heat and she had one episode of LOC when more tired.       She has had episodes of a pressure sensation in her head sometimes followed by reduced awareness but not LOC.   She is able to hear what is being said.  These can occur a few times a day and last 30 seconds or so.  There is no GTC activity, urination or tongue biting.   She feels back to herself quickly.    She only had one GTC seizure in 2011 and she was unresponsive for a short while after that one.   She also reports headaches that will be on one side of the head of the other with a pressure sensation. These come on a few times a week and will last for much of the day. She can get nausea, photophobia and phonophobia with the headache will usually does not get vomiting.    I personally reviewed the MRI of the brain dated 06/24/2017. It shows multiple T2/FLAIR hyperintense foci in the cerebellum, brainstem and hemispheres in a pattern and configuration consistent with MS. Several of the lesions including one in the periventricular right cerebellum/MCP enhances after contrast administration.  The other enhancing lesions are small and in the hemispheres.   REVIEW OF SYSTEMS: Constitutional: No fevers, chills, sweats, or change in appetite.  She notes fatigue and sometimes insomnia. Eyes: No visual changes, double vision, eye pain Ear, nose and throat: No hearing loss, ear pain, nasal congestion, sore throat Cardiovascular: No chest pain, palpitations Respiratory: No shortness of breath at rest or with exertion.   No wheezes GastrointestinaI: No nausea, vomiting,  diarrhea, abdominal pain, fecal incontinence Genitourinary: Notes frequency and rare incontinence.  No nocturia. Musculoskeletal: No neck pain, back pain Integumentary: No rash, pruritus, skin lesions Neurological: as above Psychiatric: Mild depression at this time.  Mild anxiety Endocrine: No palpitations, diaphoresis, change in appetite, change in weigh or increased thirst Hematologic/Lymphatic: No anemia, purpura, petechiae. Allergic/Immunologic: No itchy/runny eyes, nasal congestion, recent allergic reactions, rashes  ALLERGIES: No Known Allergies  HOME MEDICATIONS:  Current Outpatient Medications:    Glatiramer Acetate 40 MG/ML SOSY, glatiramer 40 mg/mL subcutaneous syringe one three times a week, Disp: 15 Syringe, Rfl: 11  PAST MEDICAL HISTORY: Past Medical History:  Diagnosis Date   Anxiety    Dizziness    Headache    Multiple sclerosis (HCC)    Syncope and collapse    Vision abnormalities     PAST SURGICAL HISTORY: Past Surgical History:  Procedure Laterality Date   NO PAST SURGERIES      FAMILY HISTORY: Family History  Problem Relation Age of Onset   Lung cancer Mother    Heart attack Father    Heart failure Father    Kidney Stones Father    Heart attack Sister    Heart disease Brother    Bell's palsy Brother     SOCIAL HISTORY:  Social History   Socioeconomic History   Marital status: Single    Spouse name: Not on file   Number of children: 0   Years of education: Not on file   Highest education level: Not on file  Occupational History   Occupation: [employer]  Social Needs   Physicist, medicalinancial resource strain: Not on file   Food insecurity:    Worry: Not on file    Inability: Not on file   Transportation needs:    Medical: Not on file    Non-medical: Not on file  Tobacco Use   Smoking status: Never Smoker   Smokeless tobacco: Never Used  Substance and Sexual Activity   Alcohol use: Yes    Comment: weekly- 2-8oz per month     Drug use: No   Sexual activity: Yes    Partners: Male  Birth control/protection: Pill  Lifestyle   Physical activity:    Days per week: Not on file    Minutes per session: Not on file   Stress: Not on file  Relationships   Social connections:    Talks on phone: Not on file    Gets together: Not on file    Attends religious service: Not on file    Active member of club or organization: Not on file    Attends meetings of clubs or organizations: Not on file    Relationship status: Not on file   Intimate partner violence:    Fear of current or ex partner: Not on file    Emotionally abused: Not on file    Physically abused: Not on file    Forced sexual activity: Not on file  Other Topics Concern   Not on file  Social History Narrative   Right handed    Caffeine use: 4 cups coffee/tea per day   Lives alone     PHYSICAL EXAM  There were no vitals filed for this visit.  There is no height or weight on file to calculate BMI.   General: The patient is well-developed and well-nourished and in no acute distress   Skin: Extremities are without rash or edema.  Neurologic Exam  Mental status: The patient is alert and oriented x 3 at the time of the examination. The patient has apparent normal recent and remote memory, with an apparently normal attention span and concentration ability.   Speech is normal.  Cranial nerves: Extraocular movements are full.  Facial strength is normal.  She has reduced facial sensation on the left.  Trapezius and sternocleidomastoid strength is normal. No dysarthria is noted.  The tongue is midline, and the patient has symmetric elevation of the soft palate. No obvious hearing deficits are noted.  Motor:  Muscle bulk is normal.   Tone is normal. Strength is  5 / 5 in all 4 extremities.   Sensory: Sensory testing shows reduced vibration in her left arm and slight temperature asymmetry, colder in the right hand.    Coordination: Cerebellar  testing reveals good finger-nose-finger and heel-to-shin bilaterally.  Gait and station: Station is normal.  Gait is normal.  Tandem gait is slightly wide.  Romberg is negative.  Reflexes: Deep tendon reflexes are increased in her knees with spread but no ankle clonus.         Ronnie Doo A. Felecia Shelling, MD, PhD, Charlynn Grimes 12/08/1094, 0:45 PM Certified in Neurology, Clinical Neurophysiology, Sleep Medicine, Pain Medicine and Neuroimaging Dir., [facility] [address], [place], [place] [address] [phone]

## 2019-05-13 ENCOUNTER — Telehealth: Payer: Self-pay | Admitting: Neurology

## 2019-05-13 NOTE — Telephone Encounter (Signed)
[employer] Auth: [id number] (exp. 05/13/19 to 11/08/19) order sent to [facility]. They will reach out to the patient to schedule.

## 2019-06-13 ENCOUNTER — Other Ambulatory Visit: Payer: Self-pay

## 2019-06-15 NOTE — Telephone Encounter (Signed)
faxed order to [facility] they will contact the patient to schedule. They are in her network. Their phone number is [phone] & fax # [phone].

## 2019-09-07 ENCOUNTER — Telehealth (INDEPENDENT_AMBULATORY_CARE_PROVIDER_SITE_OTHER): Payer: BLUE CROSS/BLUE SHIELD | Admitting: Neurology

## 2019-09-07 ENCOUNTER — Telehealth: Payer: Self-pay | Admitting: *Deleted

## 2019-09-07 DIAGNOSIS — F419 Anxiety disorder, unspecified: Secondary | ICD-10-CM | POA: Diagnosis not present

## 2019-09-07 DIAGNOSIS — R5383 Other fatigue: Secondary | ICD-10-CM | POA: Diagnosis not present

## 2019-09-07 DIAGNOSIS — R39198 Other difficulties with micturition: Secondary | ICD-10-CM | POA: Diagnosis not present

## 2019-09-07 DIAGNOSIS — Z79899 Other long term (current) drug therapy: Secondary | ICD-10-CM

## 2019-09-07 DIAGNOSIS — G35 Multiple sclerosis: Secondary | ICD-10-CM | POA: Diagnosis not present

## 2019-09-07 NOTE — Telephone Encounter (Signed)
Message from Dr. Felecia Shelling: "She had an MRI in [facility] .  I reviewed the images and it does show that she has a couple new lesions. We need to consider a different medication for her --- we can try to get her worked in this week"  I called Kristin Johnson and offered appt today at 2:30pm. Kristin Johnson stated she is currently in [place] and unsure when she will come back. Scheduled mychart VV for today at 2:30pm. Updated med list, pharmacy, allergies. She does not have access to fax machine. She is aware she may needs labs/sign start form if we are changing medication but will discuss with Dr. Felecia Shelling during [place].

## 2019-09-07 NOTE — Progress Notes (Signed)
[facility]  PATIENT: Kristin Johnson DOB: May 28, 1986  REFERRING DOCTOR OR PCP:  Windell Hummingbird, PA-C SOURCE: patient  _________________________________   HISTORICAL  CHIEF COMPLAINT:  Chief Complaint  Patient presents with   Multiple Sclerosis    on glatiramer.  Has breakthrough MRI activity    HISTORY OF PRESENT ILLNESS:  Kristin Johnson is a 33 y.o. woman with multiple sclerosis.  Update 09/07/2019: Virtual Visit via Video Note I connected with Kristin Johnson  on 09/08/19 at  2:30 PM EDT by a video enabled telemedicine application and verified that I am speaking with the correct person.  I discussed the limitations of evaluation and management by telemedicine and the availability of in person appointments. The patient expressed understanding and agreed to proceed.  History of Present Illness: MRI shows at least 3 new lesions (2 enhancing) implying breakthrough disease on glatiramer.  She also broke through Tecfidera/Vumerity (ALKS 8700 study).   She is noting a little more numbness in the lower back.  Gait is doing ok though balance is mildly off.   Strength is fine.  She also feels more 'spacey' at times with brain fog.   She has had several UTI's the past year.   She notes fatigue.  Sleep is variable.  She notes mild anxiety and depression with current events.  She continues to note some mild cognitive issues especially word finding.  We discussed options for treatment.  She prefers not to do an IV medication and is most interested in  Macrodantin   Check labs     Observations/Objective:  She is a well-developed well-nourished woman in no acute distress.  The head is normocephalic and atraumatic.  Sclera are anicteric.  Visible skin appears normal.  The neck has a good range of motion.  Pharynx and tongue have normal appearance.  She is alert and fully oriented with fluent speech and good attention, knowledge and memory.  Extraocular muscles are intact.  Facial  strength is normal.  Palatal elevation and tongue protrusion are midline.  She appears to have normal strength in the arms.  Rapid alternating movements and finger-nose-finger are performed well.   Assessment and Plan: Multiple sclerosis (HCC)  Urinary dysfunction  Anxiety  Other fatigue  1.   MRI shows at least 3 new lesions (2 enhancing) so she is having breakthrough disease on glatiramer.   She also broke through either Vumerity or Tecfidera.   We discussed options and will have her start Henrietta Dine.,   She is in [place] for a while, so we will email her a form to sign.   She had EKG last year while in the GURK2706 study and it is fine.   We will send lab orders to [place] 2.   Stay active and exercise as tolerated 3.   Macrodantin  100 mg po qHS for UTI prophylaxis 4.   rtc prn - she saw neurology in [place] in the past and will try to get back in   Follow Up Instructions: I discussed the assessment and treatment plan with the patient. The patient was provided an opportunity to ask questions and all were answered. The patient agreed with the plan and demonstrated an understanding of the instructions.    The patient was advised to call back or seek an in-person evaluation if the symptoms worsen or if the condition fails to improve as anticipated.  I provided 25 minutes of non-face-to-face time during this encounter.  Email:    [email or web address]   Update  05/11/2019 (virtual) She is generally feeling good and denies any new exacerbation or other new MS symptoms.  She is on glatiramer.  She tolerates it well and has not had any exacerbation since starting.  Before that, she was on Vumerity as part of the drug study.  Her last MRI 03/31/2018 showed multiple new enhancing lesions prompting a switch off of [id number] (Vumerity).  Images were personally reviewed.  She opted to go on Copaxone due to safety concerns.  She feels her gait is doing well.  She can climb stairs.  Balance is slightly  off at times.  She denies any numbness or weakness.  She does have fatigue though it is better than last year.  She is sleeping well most nights.  Has done well though she does have some mild anxiety and depression with current events.  She continues to note some mild cognitive issues especially word finding.     Update 10/06/2018: She was in the [id number] 74 MS.  She did have some continued breakthrough activity on MRI.  Therefore, when the blinded portion of the study was over she desired to go back on Copaxone, the medication she was on before the study.  She missed 1 1/2 weeks in a row recently when out of town unexpectedly.  She gets hives every day  However, she had also experienced some breakthrough disease on Copaxone before the drug study but was not interested in switching to a different medication at the time.  She feels her gait is stable and she denies any falls.   She has had a few dizzy spells.   She can walk a couple miles.  Strength is doing well and she has no significant numbness.    Bladder function is doing okay with mild urgency at times.    She notes fatigue but not sleepiness.   It has worsened some.    She is sleeping well 8 hours a night.  She has depression and apathy.    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    Cognition is a little worse.  She notes that processing seems worse.   She also has some verbal fluency issues.           From 07/01/2017:  In 2011, she had loss of consciousness followed by a seizure.    One of her tests was an MRI and it was worrisome for MS. The diagnosis was confirmed with CSF analysis (reportedly c/w MS).     She started on Copaxone and rarely took shots.    She had a relapse with left arm numbness last year that lasted 2-3 weeks.    Three weeks ago,  she had the onset of vertigo that is slowly improving.   The vertigo is occurring in any position and not changed much with movements.    She presented to  her primary care doctor and then the emergency room where she was admitted. MRI of the brain shows several new enhancing lesions including one in the right middle cerebellar peduncle/cerebellum adjacent to the fourth ventricle. The others were in the hemispheres. The cerebellum focus could explain her symptoms. She received several days of IV Solu-Medrol and was recently discharged and a follow-up appointment was made with me.   She feels her gait is ok.   She veers mildly to the right as she walks but leans to the left.  She can climb  stirs but feels dizzy going down them.   She denies weakness and numbness currently.  Last year, she had left arm numbness.  Vision is doing well.   She denies any episodes of diplopia or optic neuritis.   There is no eye pain    She has had some episodes of urge incontinence and notes frequency.    She denies hesitancy.    No recent UTI.  She notes anxiety, partially due to medical issues.   She notes mild depression.   She feels cognition is mildly affected with reduced concentration/focus.   She notes reduced executive function and noteed some problems copying a figure.    She is very fatigued some days, usually worse in the afternoons.  This seemed worse with the recent heat and she had one episode of LOC when more tired.       She has had episodes of a pressure sensation in her head sometimes followed by reduced awareness but not LOC.   She is able to hear what is being said.     These can occur a few times a day and last 30 seconds or so.  There is no GTC activity, urination or tongue biting.   She feels back to herself quickly.    She only had one GTC seizure in 2011 and she was unresponsive for a short while after that one.   She also reports headaches that will be on one side of the head of the other with a pressure sensation. These come on a few times a week and will last for much of the day. She can get nausea, photophobia and phonophobia with the headache  will usually does not get vomiting.    I personally reviewed the MRI of the brain dated 06/24/2017. It shows multiple T2/FLAIR hyperintense foci in the cerebellum, brainstem and hemispheres in a pattern and configuration consistent with MS. Several of the lesions including one in the periventricular right cerebellum/MCP enhances after contrast administration.  The other enhancing lesions are small and in the hemispheres.   REVIEW OF SYSTEMS: Constitutional: No fevers, chills, sweats, or change in appetite.  She notes fatigue and sometimes insomnia. Eyes: No visual changes, double vision, eye pain Ear, nose and throat: No hearing loss, ear pain, nasal congestion, sore throat Cardiovascular: No chest pain, palpitations Respiratory: No shortness of breath at rest or with exertion.   No wheezes GastrointestinaI: No nausea, vomiting, diarrhea, abdominal pain, fecal incontinence Genitourinary: Notes frequency and rare incontinence.  No nocturia. Musculoskeletal: No neck pain, back pain Integumentary: No rash, pruritus, skin lesions Neurological: as above Psychiatric: Mild depression at this time.  Mild anxiety Endocrine: No palpitations, diaphoresis, change in appetite, change in weigh or increased thirst Hematologic/Lymphatic: No anemia, purpura, petechiae. Allergic/Immunologic: No itchy/runny eyes, nasal congestion, recent allergic reactions, rashes  ALLERGIES: No Known Allergies  HOME MEDICATIONS:  Current Outpatient Medications:    Glatiramer Acetate 40 MG/ML SOSY, glatiramer 40 mg/mL subcutaneous syringe one three times a week, Disp: 15 Syringe, Rfl: 11  PAST MEDICAL HISTORY: Past Medical History:  Diagnosis Date   Anxiety    Dizziness    Headache    Multiple sclerosis (HCC)    Syncope and collapse    Vision abnormalities     PAST SURGICAL HISTORY: Past Surgical History:  Procedure Laterality Date   NO PAST SURGERIES      FAMILY HISTORY: Family History    Problem Relation Age of Onset   Lung cancer Mother  Heart attack Father    Heart failure Father    Kidney Stones Father    Heart attack Sister    Heart disease Brother    Bell's palsy Brother     SOCIAL HISTORY:  Social History   Socioeconomic History   Marital status: Single    Spouse name: Not on file   Number of children: 0   Years of education: Not on file   Highest education level: Not on file  Occupational History   Occupation: [employer]  [employer]   Financial resource strain: Not on file   Food insecurity    Worry: Not on file    Inability: Not on file   Transportation needs    Medical: Not on file    Non-medical: Not on file  Tobacco Use   Smoking status: Never Smoker   Smokeless tobacco: Never Used  Substance and Sexual Activity   Alcohol use: Yes    Comment: weekly- 2-8oz per month   Drug use: No   Sexual activity: Yes    Partners: Male    Birth control/protection: Pill  Lifestyle   Physical activity    Days per week: Not on file    Minutes per session: Not on file   Stress: Not on file  Relationships   Social connections    Talks on phone: Not on file    Gets together: Not on file    Attends religious service: Not on file    Active member of club or organization: Not on file    Attends meetings of clubs or organizations: Not on file    Relationship status: Not on file   Intimate partner violence    Fear of current or ex partner: Not on file    Emotionally abused: Not on file    Physically abused: Not on file    Forced sexual activity: Not on file  Other Topics Concern   Not on file  Social History Narrative   Right handed    Caffeine use: 4 cups coffee/tea per day   Lives alone     PHYSICAL EXAM (from 10/06/2018 visit)  There were no vitals filed for this visit.  There is no height or weight on file to calculate BMI.   General: The patient is well-developed and well-nourished and in no acute distress    Skin: Extremities are without rash or edema.  Neurologic Exam  Mental status: The patient is alert and oriented x 3 at the time of the examination. The patient has apparent normal recent and remote memory, with an apparently normal attention span and concentration ability.   Speech is normal.  Cranial nerves: Extraocular movements are full.  Facial strength is normal.  She has reduced facial sensation on the left.  Trapezius and sternocleidomastoid strength is normal. No dysarthria is noted.  The tongue is midline, and the patient has symmetric elevation of the soft palate. No obvious hearing deficits are noted.  Motor:  Muscle bulk is normal.   Tone is normal. Strength is  5 / 5 in all 4 extremities.   Sensory: Sensory testing shows reduced vibration in her left arm and slight temperature asymmetry, colder in the right hand.    Coordination: Cerebellar testing reveals good finger-nose-finger and heel-to-shin bilaterally.  Gait and station: Station is normal.  Gait is normal.  Tandem gait is slightly wide.  Romberg is negative.  Reflexes: Deep tendon reflexes are increased in her knees with spread but no ankle clonus.  Adir Schicker A. Epimenio Foot, MD, PhD, [place] 09/08/2019, 12:32 PM Certified in Neurology, Clinical Neurophysiology, Sleep Medicine, Pain Medicine and Neuroimaging Dir., [facility]  [facility] [address], [address] [place], [place] [address] [phone]

## 2019-09-08 ENCOUNTER — Encounter: Payer: Self-pay | Admitting: Neurology

## 2019-09-08 MED ORDER — NITROFURANTOIN MACROCRYSTAL 100 MG PO CAPS: 100.0000 mg | ORAL_CAPSULE | Freq: Every day | ORAL | 5 refills | Status: AC

## 2019-09-08 NOTE — Addendum Note (Signed)
Addended by: Arlice Colt A on: 09/08/2019 12:46 PM   Modules accepted: Orders

## 2019-10-07 NOTE — Addendum Note (Signed)
Addended by: Inis Sizer D on: 10/07/2019 10:31 AM   Modules accepted: Orders

## 2019-10-09 ENCOUNTER — Telehealth: Payer: Self-pay | Admitting: Neurology

## 2019-10-09 LAB — COMPREHENSIVE METABOLIC PANEL
ALT: 9 IU/L (ref 0–32)
AST: 15 IU/L (ref 0–40)
Albumin/Globulin Ratio: 1.5 (ref 1.2–2.2)
Albumin: 4.6 g/dL (ref 3.8–4.8)
Alkaline Phosphatase: 85 IU/L (ref 39–117)
BUN/Creatinine Ratio: 15 (ref 9–23)
BUN: 15 mg/dL (ref 6–20)
Bilirubin Total: 0.3 mg/dL (ref 0.0–1.2)
CO2: 25 mmol/L (ref 20–29)
Calcium: 9.8 mg/dL (ref 8.7–10.2)
Chloride: 101 mmol/L (ref 96–106)
Creatinine, Ser: 0.98 mg/dL (ref 0.57–1.00)
GFR calc Af Amer: 88 mL/min/{1.73_m2} (ref >59)
GFR calc non Af Amer: 77 mL/min/{1.73_m2} (ref >59)
Globulin, Total: 3 g/dL (ref 1.5–4.5)
Glucose: 89 mg/dL (ref 65–99)
Potassium: 4.8 mmol/L (ref 3.5–5.2)
Sodium: 138 mmol/L (ref 134–144)
Total Protein: 7.6 g/dL (ref 6.0–8.5)

## 2019-10-09 LAB — CBC WITH DIFFERENTIAL/PLATELET
Basophils Absolute: 0.1 10*3/uL (ref 0.0–0.2)
Basos: 1 %
EOS (ABSOLUTE): 0.3 10*3/uL (ref 0.0–0.4)
Eos: 4 %
Hematocrit: 41.4 % (ref 34.0–46.6)
Hemoglobin: 12.9 g/dL (ref 11.1–15.9)
Immature Grans (Abs): 0 10*3/uL (ref 0.0–0.1)
Immature Granulocytes: 0 %
Lymphocytes Absolute: 1.5 10*3/uL (ref 0.7–3.1)
Lymphs: 21 %
MCH: 25.5 pg — ABNORMAL LOW (ref 26.6–33.0)
MCHC: 31.2 g/dL — ABNORMAL LOW (ref 31.5–35.7)
MCV: 82 fL (ref 79–97)
Monocytes Absolute: 0.5 10*3/uL (ref 0.1–0.9)
Monocytes: 7 %
Neutrophils Absolute: 4.9 10*3/uL (ref 1.4–7.0)
Neutrophils: 67 %
Platelets: 395 10*3/uL (ref 150–450)
RBC: 5.05 x10E6/uL (ref 3.77–5.28)
RDW: 13.1 % (ref 11.7–15.4)
WBC: 7.3 10*3/uL (ref 3.4–10.8)

## 2019-10-09 LAB — VARICELLA ZOSTER ANTIBODY, IGG: Varicella zoster IgG: 3032 index (ref >165)

## 2019-10-09 NOTE — Telephone Encounter (Signed)
Denisha @ [facility] has called to report that re: pt's Glatiramer Acetate 40 MG/ML SOSY they are placing the order on hold.  They have made their 3rd attempt in trying to reach pt.  If there is a need Denisha @ [facility] can be reached at [phone]

## 2019-10-13 NOTE — Telephone Encounter (Signed)
Faxed completed/signed Zeposia start form to [employer] at [phone]. Received fax confirmation.

## 2019-10-13 NOTE — Addendum Note (Signed)
Addended by: Rossie Muskrat L on: 10/13/2019 12:19 PM   Modules accepted: Orders

## 2019-10-15 ENCOUNTER — Telehealth: Payer: Self-pay | Admitting: *Deleted

## 2019-10-15 NOTE — Telephone Encounter (Signed)
Submitted PA Zeposia on CMM. Key: [id number]. PA Case ID: [id number]. Received instant approval. They will fax approval info per CMM. Once we receive this, we will need to fax a copy to [place] support at [phone].

## 2019-10-26 NOTE — Telephone Encounter (Signed)
I submitted PA approval information to fax # provided.  Confirmation received.

## 2019-10-26 NOTE — Telephone Encounter (Signed)
Josh with zeposia support called to FU on PA for the zeposia states they havnt received a copy of the approval and would like to know if it could be resent if already faxed. Please follow up.      CB#[phone] EXT [phone] YBW#[phone]

## 2019-11-03 NOTE — Telephone Encounter (Signed)
Kristin Johnson with zeposia support is asking for a call from [place] to discuss pt's Specialty pharmacy

## 2019-11-03 NOTE — Telephone Encounter (Signed)
Called Josh back. Advised he can try [facility]. He will call back if there are any further issues. Pt was not sure of preferred specialty pharmacy.

## 2019-12-11 ENCOUNTER — Telehealth: Payer: Self-pay

## 2019-12-11 NOTE — Telephone Encounter (Signed)
Chris with [facility] called to report symptoms patient reported experiencing while taking zeposia   Symptoms: Headaches,3 UTI's, upper arm and should numbness, 63 heart rate,low energy and fatigue,

## 2021-04-19 ENCOUNTER — Telehealth: Payer: Self-pay | Admitting: Neurology

## 2021-04-19 NOTE — Telephone Encounter (Signed)
Called pt back. She moved to [place]. MD there is trying to put her on something for fatigue. Wanting to know what Dr. Epimenio Foot previously rx'd for this because she could not tolerate it. I reviewed chart, advised he gave her armodafinil 200mg  tab. Also gave her Adderall in the past but pt states she was able to tolerate this. She is also wanting copy of MRI completed in 2020. Advised it was done via [facility]. She can contact them to have a copy of MRI CD/report mailed to her. She verbalized understanding.

## 2021-04-19 NOTE — Telephone Encounter (Signed)
Pt is asking for a call from Kara Mead, RN to get the name of a medication that she was once on that didn't work for her, please call.
